# Patient Record
Sex: Male | Born: 2000 | Race: Black or African American | Hispanic: No | Marital: Single | Smoking: Never smoker
Health system: Southern US, Community
[De-identification: ages and names within clinical notes are randomized; demographics above are authoritative.]

## PROBLEM LIST (undated history)

## (undated) DIAGNOSIS — F329 Major depressive disorder, single episode, unspecified: Secondary | ICD-10-CM

## (undated) DIAGNOSIS — F32A Depression, unspecified: Secondary | ICD-10-CM

## (undated) DIAGNOSIS — F319 Bipolar disorder, unspecified: Secondary | ICD-10-CM

---
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
## 2020-04-02 ENCOUNTER — Emergency Department (HOSPITAL_COMMUNITY)
Admission: EM | Admit: 2020-04-02 | Discharge: 2020-04-02 | Disposition: A | Discharge status: Home / Self Care | Payer: Medicaid Other | Attending: Emergency Medicine | Admitting: Emergency Medicine

## 2020-04-02 ENCOUNTER — Emergency Department (HOSPITAL_COMMUNITY): Payer: Medicaid Other

## 2020-04-02 DIAGNOSIS — M542 Cervicalgia: Secondary | ICD-10-CM | POA: Insufficient documentation

## 2020-04-02 DIAGNOSIS — R110 Nausea: Secondary | ICD-10-CM | POA: Diagnosis not present

## 2020-04-02 DIAGNOSIS — R079 Chest pain, unspecified: Secondary | ICD-10-CM

## 2020-04-02 DIAGNOSIS — Y92410 Unspecified street and highway as the place of occurrence of the external cause: Secondary | ICD-10-CM | POA: Insufficient documentation

## 2020-04-02 DIAGNOSIS — R0781 Pleurodynia: Secondary | ICD-10-CM | POA: Diagnosis not present

## 2020-04-02 DIAGNOSIS — Z7722 Contact with and (suspected) exposure to environmental tobacco smoke (acute) (chronic): Secondary | ICD-10-CM | POA: Diagnosis not present

## 2020-04-02 DIAGNOSIS — R519 Headache, unspecified: Secondary | ICD-10-CM | POA: Insufficient documentation

## 2020-04-02 DIAGNOSIS — M546 Pain in thoracic spine: Secondary | ICD-10-CM

## 2020-04-02 DIAGNOSIS — M549 Dorsalgia, unspecified: Secondary | ICD-10-CM | POA: Insufficient documentation

## 2020-04-02 DIAGNOSIS — H53149 Visual discomfort, unspecified: Secondary | ICD-10-CM | POA: Diagnosis not present
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
## 2020-04-02 NOTE — Discharge Instructions (Addendum)
You have been seen here for neck, back and headaches.  I recommend taking over-the-counter pain medications like ibuprofen and/or Tylenol every 6 as needed.  Please follow dosage and on the back of bottle.  I also recommend applying heat to the area and stretching out the muscles as this will help decrease stiffness and pain.  I have given you information on exercises please follow.  I suspect he may be suffering from a slight concussion will recommend decreasing mental stimulation, defer excessive screen time and mentally stimulating activities.  Slowly reintroduce them as tolerated.  Please follow-up with your primary care provider in 1 week's time for reevaluation of your muscular pain.  Recommend following up with concussion clinic for worsening or continuing headaches.    Come back to the emergency department if you develop chest pain, shortness of breath, severe abdominal pain, uncontrolled nausea, vomiting, diarrhea.

## 2020-04-02 NOTE — ED Notes (Signed)
Pt d/c home per MD order. Discharge summary reviewed with pt, pt verbalizes understanding. Ambulatory off unit. No s/s of acute distress noted.  

## 2020-04-02 NOTE — ED Notes (Signed)
Pt transported to xray 

## 2020-04-03 ENCOUNTER — Telehealth: Payer: Self-pay | Admitting: *Deleted

## 2020-04-03 NOTE — Telephone Encounter (Signed)
Transition Care Management Unsuccessful Follow-up Telephone Call  Date of discharge and from where:  04-02-2020 [facility]  Attempts:  1  Reason for unsuccessful TCM follow-up call:  No answer, left voice mail

## 2020-04-04 ENCOUNTER — Telehealth: Payer: Self-pay | Admitting: *Deleted

## 2020-04-04 NOTE — Telephone Encounter (Signed)
Transition Care Management Follow-up Telephone Call  Date of discharge and from where:04-02-2020  How have you been since you were released from the hospital? no  Any questions or concerns? Concerned about headache .  Discussed importance of follow up with Primary Care. Discussed signs and symptoms of Concussion. Discussed treatments.  Items Reviewed:  Did the pt receive and understand the discharge instructions provided? Yes   Medications obtained and verified? Yes    Any new allergies since your discharge? No   Dietary orders reviewed? yes  Do you have support at home? Yes      Follow up appointments reviewed:   PCP Hospital f/u appt confirmed? No  Patient states will call today to schedule a follow up  Specialist Hospital f/u appt confirmed? No    Are transportation arrangements needed? No   If their condition worsens, is the pt aware to call PCP or go to the Emergency Dept.? yes  Was the patient provided with contact information for the PCP's office or ED? yes  Was to pt encouraged to call back with questions or concerns? yes

## 2020-04-05 NOTE — Telephone Encounter (Signed)
Duplicate

## 2020-05-04 ENCOUNTER — Ambulatory Visit: Payer: No Typology Code available for payment source | Admitting: Pediatrics

## 2020-05-24 ENCOUNTER — Encounter (HOSPITAL_COMMUNITY): Payer: Self-pay | Admitting: Internal Medicine

## 2020-05-24 ENCOUNTER — Other Ambulatory Visit: Payer: Self-pay

## 2020-05-24 ENCOUNTER — Ambulatory Visit (HOSPITAL_COMMUNITY)
Admission: EM | Admit: 2020-05-24 | Discharge: 2020-05-24 | Disposition: A | Discharge status: Home / Self Care | Payer: Medicaid Other | Attending: Internal Medicine | Admitting: Internal Medicine

## 2020-05-24 DIAGNOSIS — Z113 Encounter for screening for infections with a predominantly sexual mode of transmission: Secondary | ICD-10-CM | POA: Insufficient documentation

## 2020-05-24 DIAGNOSIS — H10212 Acute toxic conjunctivitis, left eye: Secondary | ICD-10-CM | POA: Diagnosis present

## 2020-05-24 MED ORDER — KETOROLAC TROMETHAMINE 0.5 % OP SOLN: 1.0000 [drp] | Freq: Four times a day (QID) | OPHTHALMIC | 0 refills | Status: AC | PRN

## 2020-05-24 MED ORDER — BACITRACIN-POLYMYXIN B 500-10000 UNIT/GM OP OINT: 1.0000 "application " | TOPICAL_OINTMENT | Freq: Two times a day (BID) | OPHTHALMIC | 0 refills | Status: AC

## 2020-05-24 MED ORDER — TETRACAINE HCL 0.5 % OP SOLN
OPHTHALMIC | Status: AC
  Filled 2020-05-24: qty 4

## 2020-05-24 NOTE — Discharge Instructions (Signed)
Use medications as directed If you have blurry vision, worsening eye pain or increasing light sensitivity please return to the urgent care immediately to be reevaluated. We will call you with recommendation if your lab results are abnormal.
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
## 2020-05-24 NOTE — ED Provider Notes (Signed)
[facility]    CSN: [id number] Arrival date & time: 05/24/20  1539      History   Chief Complaint Chief Complaint  Patient presents with  . Eye Problem  . SEXUALLY TRANSMITTED DISEASE    HPI [name] is a 20 y.o. male comes to the urgent care with pain/burning sensation in the left thigh which started 3 days ago.  Patient was doing some outdoor pressure washing with outdoor bleach when some of the bleach splashed into his left eye.  Following that he started experiencing burning sensation with pain in the left eye.  He did not irrigate his eye after the accident.  He denies any sensation of a foreign body in the ear.  He endorses some light sensitivity but denies any blurry vision.  He denies double vision.  Patient would like to be screened for STD.  He has no symptoms.   HPI  Past Medical History:  Diagnosis Date  . Bipolar 1 disorder (HCC)   . Chlamydia infection 06/03/2016   4/26 pos test, 5/1 treated  . Depression     Patient Active Problem List   Diagnosis Date Noted  . Anxiety state 08/24/2019  . History of gonorrhea 02/03/2019  . Severe recurrent major depression with psychotic features (HCC) 09/25/2018  . 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 Syncope 01/07/2018  . Chest wall pain 01/07/2018  . Poor diet 01/07/2018  . Allergic rhinitis 08/05/2017  . Abnormal hearing screen 08/05/2017  . History of hallucinations 07/01/2017  . -chronic right-sided headaches- concerning for migraines 05/29/2016  . Acne vulgaris 05/29/2016    History reviewed. No pertinent surgical history.     Home Medications    Prior to Admission medications   Medication Sig Start Date End Date Taking? Authorizing Provider  bacitracin-polymyxin b (POLYSPORIN) ophthalmic ointment Place 1 application into the left eye every 12 (twelve) hours for 5 days. apply to eye every 12 hours while awake 05/24/20 05/29/20 Yes Fumio Vandam, Britta Mccreedy, MD  ketorolac (ACULAR) 0.5 %  ophthalmic solution Place 1 drop into both eyes every 6 (six) hours as needed for up to 5 days. 05/24/20 05/29/20 Yes Namine Beahm, Britta Mccreedy, MD  promethazine (PHENERGAN) 25 MG tablet Take 1 tablet (25 mg total) by mouth every 6 (six) hours as needed for nausea or vomiting. Patient not taking: Reported on 05/26/2019 04/01/19 05/24/20  Gilda Crease, MD    Family History Family History  Problem Relation Age of Onset  . Healthy Mother   . Heart disease Neg Hx     Social History Social History   Tobacco Use  . Smoking status: Passive Smoke Exposure - Never Smoker  . Smokeless tobacco: Never Used  . Tobacco comment: mom smokes outside   Vaping Use  . Vaping Use: Some days  Substance Use Topics  . Alcohol use: Never  . Drug use: Not Currently    Types: Marijuana     Allergies   Patient has no known allergies.   Review of Systems Review of Systems  Eyes: Positive for photophobia, pain and redness. Negative for discharge, itching and visual disturbance.  Respiratory: Negative.   Gastrointestinal: Negative.   Genitourinary: Negative.   Neurological: Negative.      Physical Exam Triage Vital Signs ED Triage Vitals  Enc Vitals Group     BP 05/24/20 1607 108/78     Pulse Rate 05/24/20 1607 82     Resp 05/24/20 1607 18     Temp 05/24/20 1607 99.1  F (37.3 C)     Temp Source 05/24/20 1607 Oral     SpO2 05/24/20 1607 98 %     Weight --      Height --      Head Circumference --      Peak Flow --      Pain Score 05/24/20 1604 7     Pain Loc --      Pain Edu? --      Excl. in GC? --    No data found.  Updated Vital Signs BP 108/78 (BP Location: Left Arm)   Pulse 82   Temp 99.1 F (37.3 C) (Oral)   Resp 18   SpO2 98%   Visual Acuity Right Eye Distance: 20/20 Left Eye Distance: 20/20 Bilateral Distance: 20/20  Right Eye Near:   Left Eye Near:    Bilateral Near:     Physical Exam Vitals and nursing note reviewed.  HENT:     Right Ear: Tympanic membrane  normal.     Left Ear: Tympanic membrane normal.  Eyes:     Extraocular Movements: Extraocular movements intact.     Pupils: Pupils are equal, round, and reactive to light.     Comments: Conjunctival injection on the left eye.  No scleral icterus.  No corneal haziness.  Skin:    General: Skin is warm.      UC Treatments / Results  Labs (all labs ordered are listed, but only abnormal results are displayed) Labs Reviewed  CYTOLOGY, (ORAL, ANAL, URETHRAL) ANCILLARY ONLY    EKG   Radiology No results found.  Procedures Procedures (including critical care time)  Medications Ordered in UC Medications - No data to display  Initial Impression / Assessment and Plan / UC Course  I have reviewed the triage vital signs and the nursing notes.  Pertinent labs & imaging results that were available during my care of the patient were reviewed by me and considered in my medical decision making (see chart for details).     1.  Chemical conjunctivitis involving the left eye: Fluorescein stain was negative for corneal abrasion Ketorolac eyedrops as needed for pain Polysporin ophthalmic ointment twice daily for 5 days If symptoms worsen please return to urgent care to be reevaluated. Will call you with recommendations if lab results are abnormal. Final Clinical Impressions(s) / UC Diagnoses   Final diagnoses:  Chemical conjunctivitis of left eye  Screen for STD (sexually transmitted disease)     Discharge Instructions     Use medications as directed If you have blurry vision, worsening eye pain or increasing light sensitivity please return to the urgent care immediately to be reevaluated. We will call you with recommendation if your lab results are abnormal.   ED Prescriptions    Medication Sig Dispense Auth. Provider   bacitracin-polymyxin b (POLYSPORIN) ophthalmic ointment Place 1 application into the left eye every 12 (twelve) hours for 5 days. apply to eye every 12 hours  while awake 3.5 g Kani Jobson, Britta Mccreedy, MD   ketorolac (ACULAR) 0.5 % ophthalmic solution Place 1 drop into both eyes every 6 (six) hours as needed for up to 5 days. 5 mL Nonie Lochner, Britta Mccreedy, MD     PDMP not reviewed this encounter.   Merrilee Jansky, MD 05/24/20 [phone]

## 2020-05-25 LAB — CYTOLOGY, (ORAL, ANAL, URETHRAL) ANCILLARY ONLY
Chlamydia: NEGATIVE
Comment: NEGATIVE
Comment: NEGATIVE
Comment: NORMAL
Neisseria Gonorrhea: NEGATIVE
Trichomonas: NEGATIVE

## 2020-06-26 ENCOUNTER — Ambulatory Visit (HOSPITAL_COMMUNITY)
Admission: EM | Admit: 2020-06-26 | Discharge: 2020-06-26 | Disposition: A | Discharge status: Home / Self Care | Payer: Medicaid Other | Attending: Internal Medicine | Admitting: Internal Medicine

## 2020-06-26 ENCOUNTER — Other Ambulatory Visit: Payer: Self-pay

## 2020-06-26 ENCOUNTER — Ambulatory Visit (INDEPENDENT_AMBULATORY_CARE_PROVIDER_SITE_OTHER): Payer: Medicaid Other

## 2020-06-26 ENCOUNTER — Encounter (HOSPITAL_COMMUNITY): Payer: Self-pay | Admitting: *Deleted

## 2020-06-26 DIAGNOSIS — S6992XA Unspecified injury of left wrist, hand and finger(s), initial encounter: Secondary | ICD-10-CM

## 2020-06-26 DIAGNOSIS — S60222A Contusion of left hand, initial encounter: Secondary | ICD-10-CM

## 2020-06-26 DIAGNOSIS — M79642 Pain in left hand: Secondary | ICD-10-CM | POA: Diagnosis not present

## 2020-06-26 MED ORDER — NAPROXEN 500 MG PO TABS: 500.0000 mg | ORAL_TABLET | Freq: Two times a day (BID) | ORAL | 0 refills | Status: DC

## 2020-06-26 NOTE — Discharge Instructions (Signed)
You can take the Naproxen twice a day as needed for pain.   Rest as much as possible Ice for 10-15 minutes every 4-6 hours as needed for pain and swelling Compression- you can use an ace bandage or splint for comfort Elevate above your heart when sitting and laying down  Follow up with sports medicine or orthopedics if symptoms do not improve in the next week.   If you develop any loss of feeling, numbness, tingling, your fingers are cold to the touch, or blue, go to the Emergency Department for further evaluation.

## 2020-06-26 NOTE — ED Triage Notes (Signed)
PT closed truck door on his hand yesterday. Pt reports pain and swelling to hand and wrist. Pt moves Lt index finger and Lt middle finger. He has limited movement of Lt ring finger and Lt small finger.

## 2020-06-26 NOTE — ED Provider Notes (Signed)
[facility]    CSN: [id number] Arrival date & time: 06/26/20  3007      History   Chief Complaint Chief Complaint  Patient presents with  . Hand Injury    HPI [name] is a 20 y.o. male.   Patient here for evaluation of left hand pain after closing it in a door last night.  Patient with decreased range of motion, tenderness, and swelling.  Has not taken any OTC medications or treatments.  Reports pain as achy and constant.  Pain worse with movement.   Denies any fevers, chest pain, shortness of breath, N/V/D, numbness, tingling, weakness, abdominal pain, or headaches.    The history is provided by the patient.  Hand Injury   Past Medical History:  Diagnosis Date  . Bipolar 1 disorder (HCC)   . Chlamydia infection 06/03/2016   4/26 pos test, 5/1 treated  . Depression     Patient Active Problem List   Diagnosis Date Noted  . Anxiety state 08/24/2019  . History of gonorrhea 02/03/2019  . Severe recurrent major depression with psychotic features (HCC) 09/25/2018  . 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 Syncope 01/07/2018  . Chest wall pain 01/07/2018  . Poor diet 01/07/2018  . Allergic rhinitis 08/05/2017  . Abnormal hearing screen 08/05/2017  . History of hallucinations 07/01/2017  . -chronic right-sided headaches- concerning for migraines 05/29/2016  . Acne vulgaris 05/29/2016    History reviewed. No pertinent surgical history.     Home Medications    Prior to Admission medications   Medication Sig Start Date End Date Taking? Authorizing Provider  naproxen (NAPROSYN) 500 MG tablet Take 1 tablet (500 mg total) by mouth 2 (two) times daily. 06/26/20  Yes Ivette Loyal, NP  promethazine (PHENERGAN) 25 MG tablet Take 1 tablet (25 mg total) by mouth every 6 (six) hours as needed for nausea or vomiting. Patient not taking: Reported on 05/26/2019 04/01/19 05/24/20  Gilda Crease, MD    Family History Family History  Problem Relation  Age of Onset  . Healthy Mother   . Heart disease Neg Hx     Social History Social History   Tobacco Use  . Smoking status: Passive Smoke Exposure - Never Smoker  . Smokeless tobacco: Never Used  . Tobacco comment: mom smokes outside   Vaping Use  . Vaping Use: Some days  Substance Use Topics  . Alcohol use: Never  . Drug use: Not Currently    Types: Marijuana     Allergies   Patient has no known allergies.   Review of Systems Review of Systems  Musculoskeletal: Positive for arthralgias, joint swelling and myalgias.  All other systems reviewed and are negative.    Physical Exam Triage Vital Signs ED Triage Vitals  Enc Vitals Group     BP 06/26/20 1100 112/73     Pulse Rate 06/26/20 1100 70     Resp 06/26/20 1100 16     Temp 06/26/20 1100 98.5 F (36.9 C)     Temp src --      SpO2 06/26/20 1100 99 %     Weight --      Height --      Head Circumference --      Peak Flow --      Pain Score 06/26/20 1058 8     Pain Loc --      Pain Edu? --      Excl. in GC? --    No  data found.  Updated Vital Signs BP 112/73   Pulse 70   Temp 98.5 F (36.9 C)   Resp 16   SpO2 99%   Visual Acuity Right Eye Distance:   Left Eye Distance:   Bilateral Distance:    Right Eye Near:   Left Eye Near:    Bilateral Near:     Physical Exam Vitals and nursing note reviewed.  Constitutional:      General: He is not in acute distress.    Appearance: Normal appearance. He is not ill-appearing, toxic-appearing or diaphoretic.  HENT:     Head: Normocephalic and atraumatic.  Eyes:     Conjunctiva/sclera: Conjunctivae normal.  Cardiovascular:     Rate and Rhythm: Normal rate.     Pulses: Normal pulses.  Pulmonary:     Effort: Pulmonary effort is normal.  Abdominal:     General: Abdomen is flat.  Musculoskeletal:     Left hand: Swelling, tenderness (primarily to the MCP joint of the 5th and 4th fingers) and bony tenderness present. No lacerations. Decreased range of  motion. Decreased strength of finger abduction and thumb/finger opposition. Normal strength of wrist extension. Normal sensation. Normal capillary refill. Normal pulse.     Cervical back: Normal range of motion.  Skin:    General: Skin is warm and dry.  Neurological:     General: No focal deficit present.     Mental Status: He is alert and oriented to person, place, and time.  Psychiatric:        Mood and Affect: Mood normal.      UC Treatments / Results  Labs (all labs ordered are listed, but only abnormal results are displayed) Labs Reviewed - No data to display  EKG   Radiology DG Hand Complete Left  Result Date: 06/26/2020 CLINICAL DATA:  Closed hand in door with pain, initial encounter EXAM: LEFT HAND - COMPLETE 3+ VIEW COMPARISON:  None. FINDINGS: There is no evidence of fracture or dislocation. There is no evidence of arthropathy or other focal bone abnormality. Soft tissues are unremarkable. IMPRESSION: No acute abnormality noted. Electronically Signed   By: Alcide Clever M.D.   On: 06/26/2020 11:41    Procedures Procedures (including critical care time)  Medications Ordered in UC Medications - No data to display  Initial Impression / Assessment and Plan / UC Course  I have reviewed the triage vital signs and the nursing notes.  Pertinent labs & imaging results that were available during my care of the patient were reviewed by me and considered in my medical decision making (see chart for details).     Assessment negative for red flags or concerns. Xray with no acute abnormality noted.  Naproxen twice a day as needed for pain.  Recommend rest, ice, compression for comfort with an ace bandage, and elevation.  Recommend following up with ortho or sports medicine if symptoms do not improve in the next week.  Strict ED follow up for any worsening symptoms, such as numbness, tingling, fingers cold or blue.   Final Clinical Impressions(s) / UC Diagnoses   Final diagnoses:   Contusion of left hand, initial encounter  Injury of left hand, initial encounter     Discharge Instructions     You can take the Naproxen twice a day as needed for pain.   Rest as much as possible Ice for 10-15 minutes every 4-6 hours as needed for pain and swelling Compression- you can use an ace bandage or splint for  comfort Elevate above your heart when sitting and laying down  Follow up with sports medicine or orthopedics if symptoms do not improve in the next week.   If you develop any loss of feeling, numbness, tingling, your fingers are cold to the touch, or blue, go to the Emergency Department for further evaluation.      ED Prescriptions    Medication Sig Dispense Auth. Provider   naproxen (NAPROSYN) 500 MG tablet Take 1 tablet (500 mg total) by mouth 2 (two) times daily. 30 tablet Ivette Loyal, NP     PDMP not reviewed this encounter.   Ivette Loyal, NP 06/26/20 1157

## 2021-05-31 ENCOUNTER — Ambulatory Visit (HOSPITAL_COMMUNITY)
Admission: EM | Admit: 2021-05-31 | Discharge: 2021-05-31 | Disposition: A | Discharge status: Home / Self Care | Payer: Medicaid Other | Attending: Family Medicine | Admitting: Family Medicine

## 2021-05-31 ENCOUNTER — Encounter (HOSPITAL_COMMUNITY): Payer: Self-pay | Admitting: Emergency Medicine

## 2021-05-31 DIAGNOSIS — Z202 Contact with and (suspected) exposure to infections with a predominantly sexual mode of transmission: Secondary | ICD-10-CM | POA: Diagnosis present

## 2021-05-31 NOTE — ED Provider Notes (Signed)
?[facility] ? ? ? ?CSN: [id number] ?Arrival date & time: 05/31/21  1338 ? ? ?  ? ?History   ?Chief Complaint ?Chief Complaint  ?Patient presents with  ? Exposure to STD  ? ? ?HPI ?[name] is a 21 y.o. male.  ? ? ?Exposure to STD ? ?Here for STD testing. No dysuria or hematuria or penile dc. No f/c. No specific concerns voiced. He declines HIV/RPR testing. ? ?Past Medical History:  ?Diagnosis Date  ? Bipolar 1 disorder (HCC)   ? Chlamydia infection 06/03/2016  ? 4/26 pos test, 5/1 treated  ? Depression   ? ? ?Patient Active Problem List  ? Diagnosis Date Noted  ? Anxiety state 08/24/2019  ? History of gonorrhea 02/03/2019  ? Severe recurrent major depression with psychotic features (HCC) 09/25/2018  ? Delusional disorder (HCC) 09/25/2018  ? Syncope 01/07/2018  ? Chest wall pain 01/07/2018  ? Poor diet 01/07/2018  ? Allergic rhinitis 08/05/2017  ? Abnormal hearing screen 08/05/2017  ? History of hallucinations 07/01/2017  ? -chronic right-sided headaches- concerning for migraines 05/29/2016  ? Acne vulgaris 05/29/2016  ? ? ?History reviewed. No pertinent surgical history. ? ? ? ? ?Home Medications   ? ?Prior to Admission medications   ?Medication Sig Start Date End Date Taking? Authorizing Provider  ?promethazine (PHENERGAN) 25 MG tablet Take 1 tablet (25 mg total) by mouth every 6 (six) hours as needed for nausea or vomiting. ?Patient not taking: Reported on 05/26/2019 04/01/19 05/24/20  Gilda Crease, MD  ? ? ?Family History ?Family History  ?Problem Relation Age of Onset  ? Healthy Mother   ? Heart disease Neg Hx   ? ? ?Social History ?Social History  ? ?Tobacco Use  ? Smoking status: Passive Smoke Exposure - Never Smoker  ? Smokeless tobacco: Never  ? Tobacco comments:  ?  mom smokes outside   ?Vaping Use  ? Vaping Use: Some days  ?Substance Use Topics  ? Alcohol use: Never  ? Drug use: Not Currently  ?  Types: Marijuana  ? ? ? ?Allergies   ?Patient has no known allergies. ? ? ?Review of  Systems ?Review of Systems ? ? ?Physical Exam ?Triage Vital Signs ?ED Triage Vitals  ?Enc Vitals Group  ?   BP 05/31/21 1549 117/71  ?   Pulse Rate 05/31/21 1549 73  ?   Resp 05/31/21 1549 16  ?   Temp 05/31/21 1549 98.6 ?F (37 ?C)  ?   Temp Source 05/31/21 1549 Oral  ?   SpO2 05/31/21 1549 98 %  ?   Weight 05/31/21 1547 160 lb 0.9 oz (72.6 kg)  ?   Height 05/31/21 1547 5\' 9"  (1.753 m)  ?   Head Circumference --   ?   Peak Flow --   ?   Pain Score 05/31/21 1547 0  ?   Pain Loc --   ?   Pain Edu? --   ?   Excl. in GC? --   ? ?No data found. ? ?Updated Vital Signs ?BP 117/71 (BP Location: Right Arm)   Pulse 73   Temp 98.6 ?F (37 ?C) (Oral)   Resp 16   Ht 5\' 9"  (1.753 m)   Wt 72.6 kg   SpO2 98%   BMI 23.64 kg/m?  ? ?Visual Acuity ?Right Eye Distance:   ?Left Eye Distance:   ?Bilateral Distance:   ? ?Right Eye Near:   ?Left Eye Near:    ?Bilateral Near:    ? ?  Physical Exam ?Vitals and nursing note reviewed.  ?Constitutional:   ?   General: He is not in acute distress. ?   Appearance: He is not toxic-appearing.  ?Skin: ?   Capillary Refill: Capillary refill takes less than 2 seconds.  ?   Coloration: Skin is not jaundiced or pale.  ?Neurological:  ?   Mental Status: He is alert and oriented to person, place, and time.  ?Psychiatric:     ?   Behavior: Behavior normal.  ? ? ? ?UC Treatments / Results  ?Labs ?(all labs ordered are listed, but only abnormal results are displayed) ?Labs Reviewed  ?CYTOLOGY, (ORAL, ANAL, URETHRAL) ANCILLARY ONLY  ? ? ?EKG ? ? ?Radiology ?No results found. ? ?Procedures ?Procedures (including critical care time) ? ?Medications Ordered in UC ?Medications - No data to display ? ?Initial Impression / Assessment and Plan / UC Course  ?I have reviewed the triage vital signs and the nursing notes. ? ?Pertinent labs & imaging results that were available during my care of the patient were reviewed by me and considered in my medical decision making (see chart for details). ? ?  ? ?Staff will call  him if any positives, to treat per protocol. ?Final Clinical Impressions(s) / UC Diagnoses  ? ?Final diagnoses:  ?Possible exposure to STD  ? ? ? ?Discharge Instructions   ? ?  ?Staff will call you with any positives on the testing, to get you treated. ? ? ? ? ?ED Prescriptions   ?None ?  ? ?PDMP not reviewed this encounter. ?  ?Zenia Resides, MD ?05/31/21 1628 ? ?

## 2021-05-31 NOTE — ED Triage Notes (Signed)
Pt presents to UC for STD testing. Denies any symptoms.  ?

## 2021-05-31 NOTE — Discharge Instructions (Addendum)
Staff will call you with any positives on the testing, to get you treated. ?

## 2021-06-04 LAB — CYTOLOGY, (ORAL, ANAL, URETHRAL) ANCILLARY ONLY
Chlamydia: NEGATIVE
Comment: NEGATIVE
Comment: NEGATIVE
Comment: NORMAL
Neisseria Gonorrhea: NEGATIVE
Trichomonas: NEGATIVE

## 2021-07-15 ENCOUNTER — Emergency Department (HOSPITAL_COMMUNITY): Payer: Medicaid Other

## 2021-07-15 ENCOUNTER — Emergency Department (HOSPITAL_COMMUNITY)
Admission: EM | Admit: 2021-07-15 | Discharge: 2021-07-15 | Disposition: A | Discharge status: Home / Self Care | Payer: Medicaid Other | Attending: Emergency Medicine | Admitting: Emergency Medicine

## 2021-07-15 ENCOUNTER — Other Ambulatory Visit: Payer: Self-pay

## 2021-07-15 DIAGNOSIS — S0081XA Abrasion of other part of head, initial encounter: Secondary | ICD-10-CM | POA: Diagnosis not present

## 2021-07-15 DIAGNOSIS — Y92410 Unspecified street and highway as the place of occurrence of the external cause: Secondary | ICD-10-CM | POA: Diagnosis not present

## 2021-07-15 DIAGNOSIS — Z23 Encounter for immunization: Secondary | ICD-10-CM | POA: Diagnosis not present

## 2021-07-15 DIAGNOSIS — T1490XA Injury, unspecified, initial encounter: Secondary | ICD-10-CM

## 2021-07-15 DIAGNOSIS — R1032 Left lower quadrant pain: Secondary | ICD-10-CM | POA: Diagnosis not present

## 2021-07-15 DIAGNOSIS — S0993XA Unspecified injury of face, initial encounter: Secondary | ICD-10-CM | POA: Diagnosis present

## 2021-07-15 LAB — CBC
HCT: 46.3 % (ref 39.0–52.0)
Hemoglobin: 15.8 g/dL (ref 13.0–17.0)
MCH: 32.7 pg (ref 26.0–34.0)
MCHC: 34.1 g/dL (ref 30.0–36.0)
MCV: 95.9 fL (ref 80.0–100.0)
Platelets: 212 10*3/uL (ref 150–400)
RBC: 4.83 MIL/uL (ref 4.22–5.81)
RDW: 11.5 % (ref 11.5–15.5)
WBC: 3.8 10*3/uL — ABNORMAL LOW (ref 4.0–10.5)
nRBC: 0 % (ref 0.0–0.2)

## 2021-07-15 LAB — URINALYSIS, ROUTINE W REFLEX MICROSCOPIC
Bilirubin Urine: NEGATIVE
Glucose, UA: NEGATIVE mg/dL
Hgb urine dipstick: NEGATIVE
Ketones, ur: NEGATIVE mg/dL
Leukocytes,Ua: NEGATIVE
Nitrite: NEGATIVE
Protein, ur: NEGATIVE mg/dL
Specific Gravity, Urine: 1.044 — ABNORMAL HIGH (ref 1.005–1.030)
pH: 6 (ref 5.0–8.0)

## 2021-07-15 LAB — I-STAT CHEM 8, ED
BUN: 17 mg/dL (ref 6–20)
Calcium, Ion: 1.12 mmol/L — ABNORMAL LOW (ref 1.15–1.40)
Chloride: 105 mmol/L (ref 98–111)
Creatinine, Ser: 1.5 mg/dL — ABNORMAL HIGH (ref 0.61–1.24)
Glucose, Bld: 93 mg/dL (ref 70–99)
HCT: 46 % (ref 39.0–52.0)
Hemoglobin: 15.6 g/dL (ref 13.0–17.0)
Potassium: 4.6 mmol/L (ref 3.5–5.1)
Sodium: 139 mmol/L (ref 135–145)
TCO2: 23 mmol/L (ref 22–32)

## 2021-07-15 LAB — COMPREHENSIVE METABOLIC PANEL
ALT: 23 U/L (ref 0–44)
AST: 19 U/L (ref 15–41)
Albumin: 4.2 g/dL (ref 3.5–5.0)
Alkaline Phosphatase: 51 U/L (ref 38–126)
Anion gap: 7 (ref 5–15)
BUN: 15 mg/dL (ref 6–20)
CO2: 25 mmol/L (ref 22–32)
Calcium: 9.3 mg/dL (ref 8.9–10.3)
Chloride: 106 mmol/L (ref 98–111)
Creatinine, Ser: 1.39 mg/dL — ABNORMAL HIGH (ref 0.61–1.24)
GFR, Estimated: >60 mL/min (ref >60)
Glucose, Bld: 98 mg/dL (ref 70–99)
Potassium: 4.4 mmol/L (ref 3.5–5.1)
Sodium: 138 mmol/L (ref 135–145)
Total Bilirubin: 0.7 mg/dL (ref 0.3–1.2)
Total Protein: 7 g/dL (ref 6.5–8.1)

## 2021-07-15 LAB — SAMPLE TO BLOOD BANK

## 2021-07-15 LAB — PROTIME-INR
INR: 1 (ref 0.8–1.2)
Prothrombin Time: 13.2 seconds (ref 11.4–15.2)

## 2021-07-15 LAB — LACTIC ACID, PLASMA: Lactic Acid, Venous: 1.2 mmol/L (ref 0.5–1.9)

## 2021-07-15 LAB — ETHANOL: Alcohol, Ethyl (B): <10 mg/dL (ref <10)

## 2021-07-15 MED ORDER — IOHEXOL 350 MG/ML SOLN
100.0000 mL | Freq: Once | INTRAVENOUS | Status: AC | PRN
  Administered 2021-07-15: 100 mL via INTRAVENOUS

## 2021-07-15 MED ORDER — METHOCARBAMOL 500 MG PO TABS: 1000.0000 mg | ORAL_TABLET | Freq: Two times a day (BID) | ORAL | 0 refills | Status: AC

## 2021-07-15 MED ORDER — ACETAMINOPHEN 325 MG PO TABS
650.0000 mg | ORAL_TABLET | Freq: Four times a day (QID) | ORAL | 0 refills | Status: DC | PRN
Start: 2021-07-15 — End: 2022-08-19

## 2021-07-15 MED ORDER — IBUPROFEN 600 MG PO TABS: 600.0000 mg | ORAL_TABLET | Freq: Four times a day (QID) | ORAL | 0 refills | Status: DC | PRN

## 2021-07-15 MED ORDER — LACTATED RINGERS IV BOLUS
1000.0000 mL | Freq: Once | INTRAVENOUS | Status: AC
  Administered 2021-07-15: 1000 mL via INTRAVENOUS

## 2021-07-15 MED ORDER — TETANUS-DIPHTH-ACELL PERTUSSIS 5-2.5-18.5 LF-MCG/0.5 IM SUSY
0.5000 mL | PREFILLED_SYRINGE | Freq: Once | INTRAMUSCULAR | Status: AC
  Administered 2021-07-15: 0.5 mL via INTRAMUSCULAR
  Filled 2021-07-15: qty 0.5
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
## 2021-07-15 NOTE — ED Notes (Signed)
Family at bedside, updated on POC Urine sample obtained and sent to lab.

## 2021-07-15 NOTE — Discharge Instructions (Addendum)
It was a pleasure caring for you today in the emergency department. ° °Please return to the emergency department for any worsening or worrisome symptoms. ° ° °
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
## 2021-07-15 NOTE — ED Notes (Signed)
Ccollar removed, ok per EDP
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
## 2021-07-15 NOTE — ED Notes (Signed)
CT tech to inquire regarding results of CAP
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
## 2021-07-15 NOTE — ED Notes (Signed)
Pt and family verbalize understanding of discharge instructions, follow up and medications. Pt ambulatory to WR with family. NAD, steady gait.
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
## 2021-07-16 IMAGING — CT CT ABD-PELV W/ CM
2 of 4 series · 15 of 46 positions shown, 17 images · IV contrast (APPLIED)
Comparison: None

CLINICAL DATA: Generalized abdominal pain with emesis, mild
dizziness

EXAM:
CT ABDOMEN AND PELVIS WITH CONTRAST
TECHNIQUE: Multidetector CT imaging of the abdomen and pelvis was performed
using the standard protocol following bolus administration of
intravenous contrast.
CONTRAST:  100mL OMNIPAQUE IOHEXOL 300 MG/ML  SOLN

[Series 3: abdomen 5.0 · axial · 0.77mm/px · z∈[-708,-283]mm · 12 of 97 slices shown, 14 images]
[im 6/97  soft-tissue]
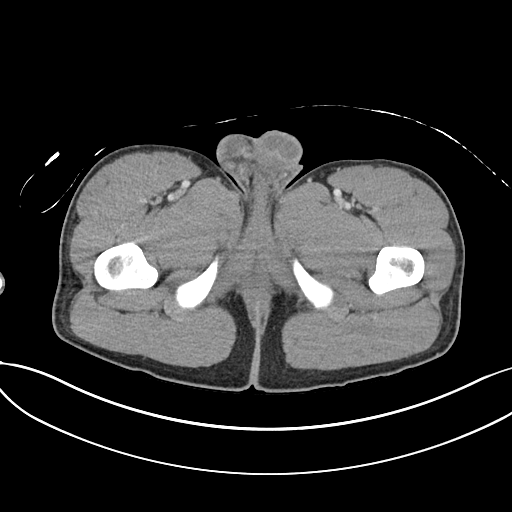
[im 6/97  bone]
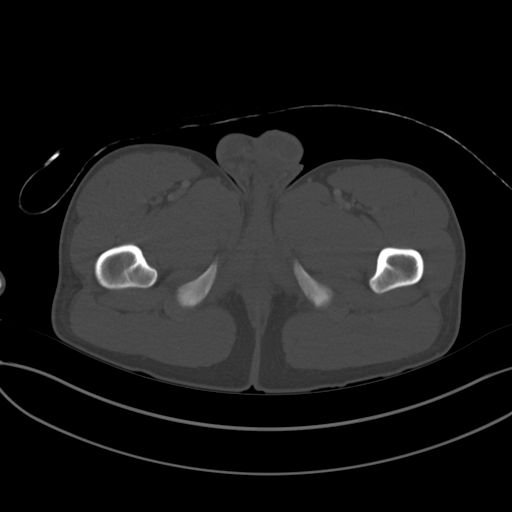
[im 17/97  soft-tissue]
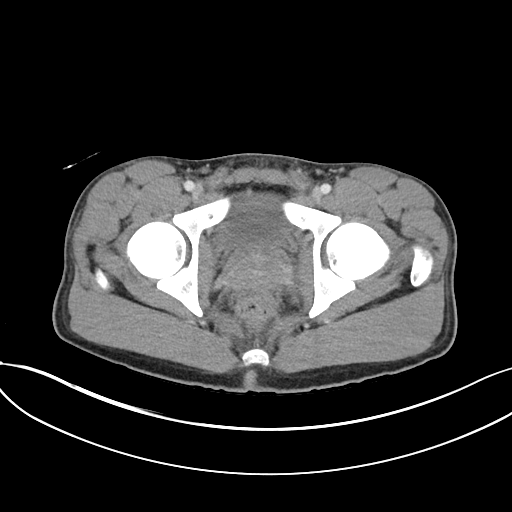
[im 22/97  soft-tissue]
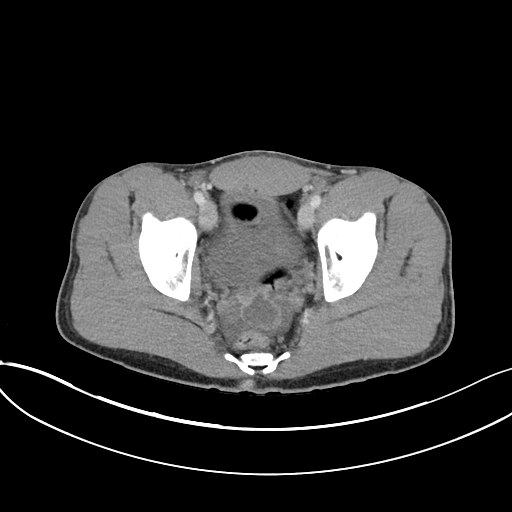
[im 27/97  soft-tissue]
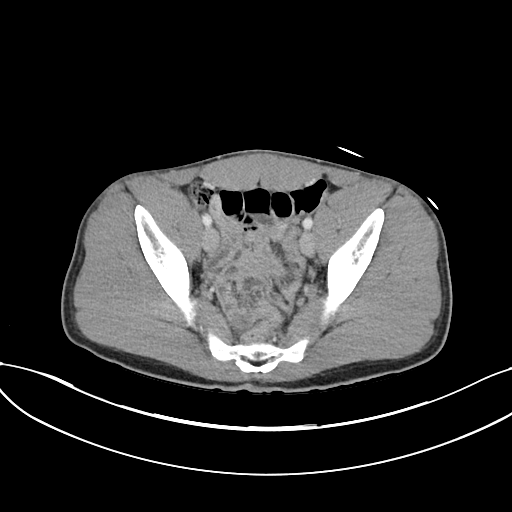
[im 38/97  soft-tissue]
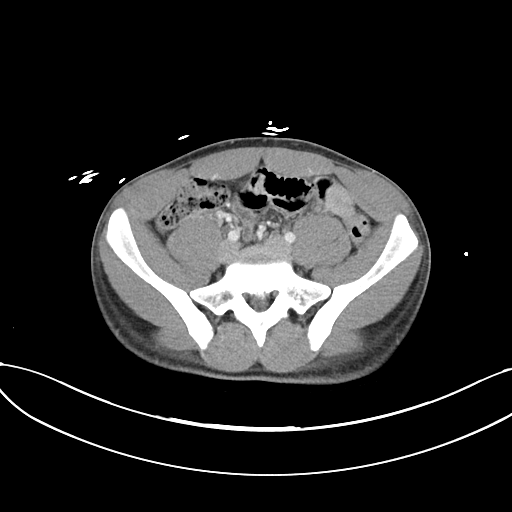
[im 43/97  soft-tissue]
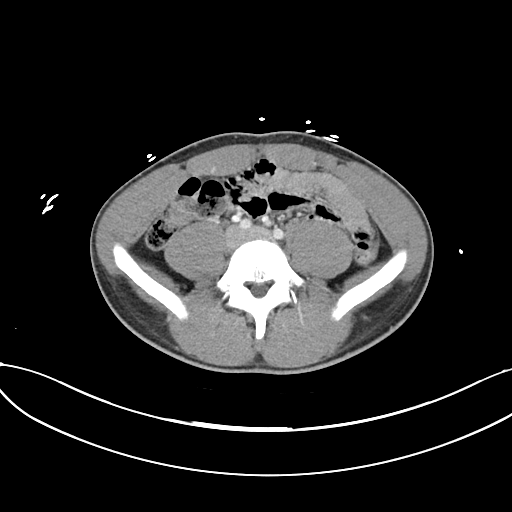
[im 54/97  soft-tissue]
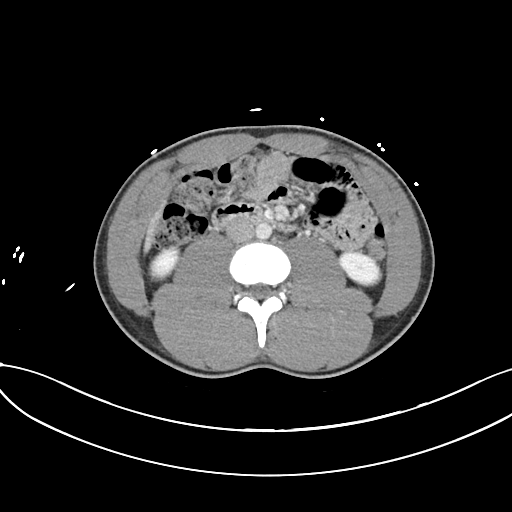
[im 59/97  soft-tissue]
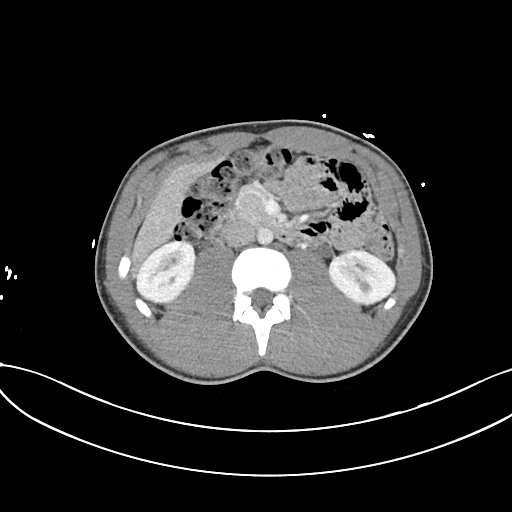
[im 70/97  soft-tissue]
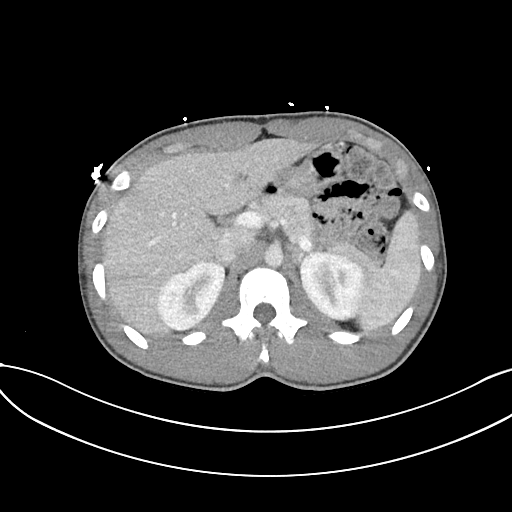
[im 70/97  bone]
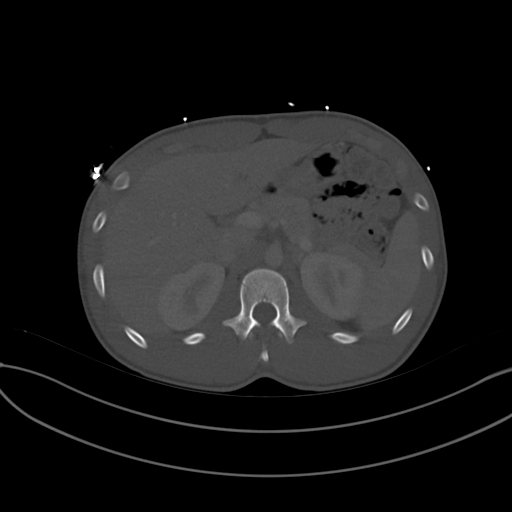
[im 75/97  soft-tissue]
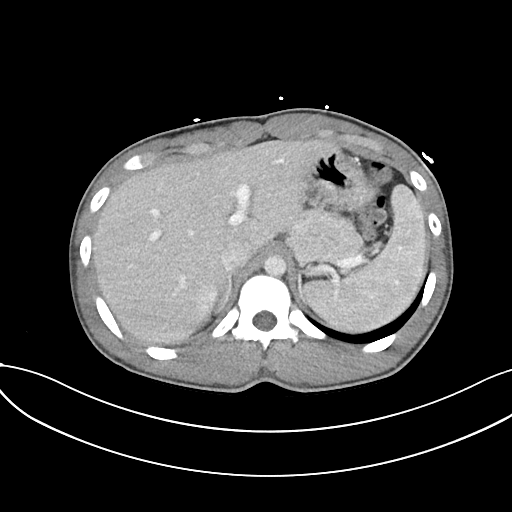
[im 81/97  soft-tissue]
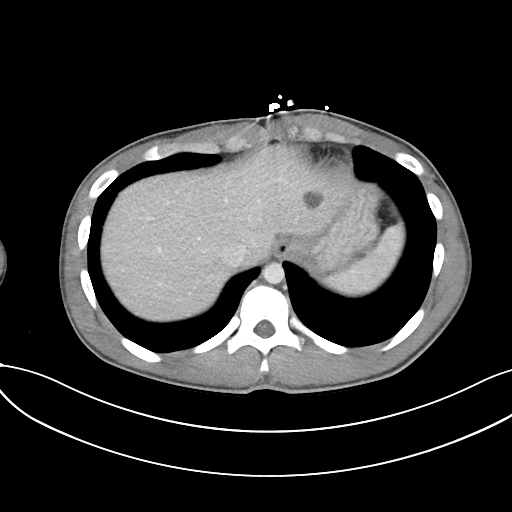
[im 91/97  soft-tissue]
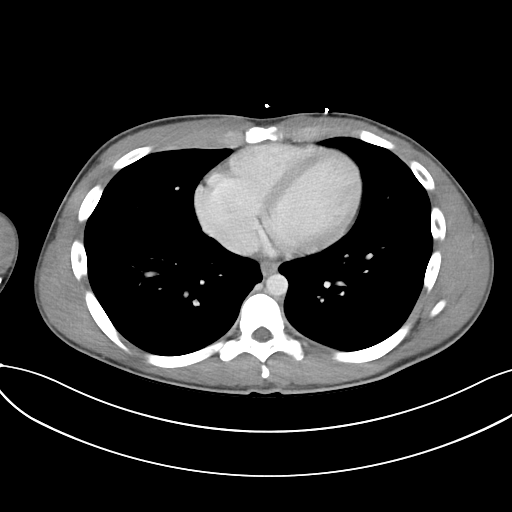

[Series 6: abdomen 3.0 mpr cor · coronal · 0.65mm/px · 3 of 78 slices shown]
[im 26/78  soft-tissue]
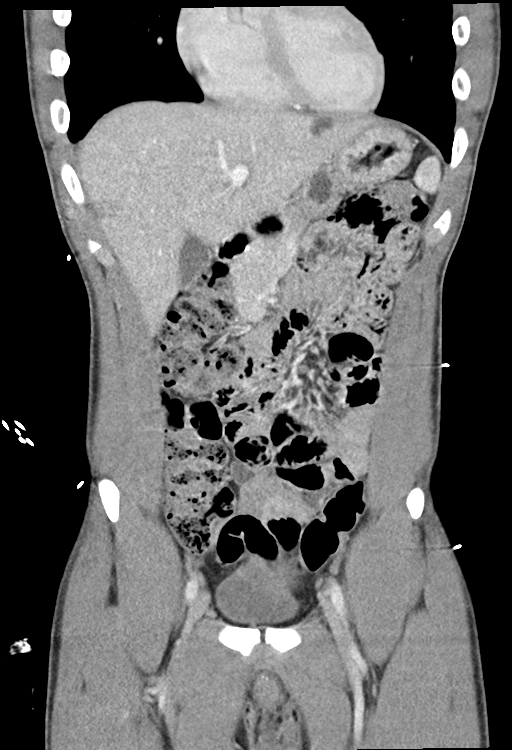
[im 35/78  soft-tissue]
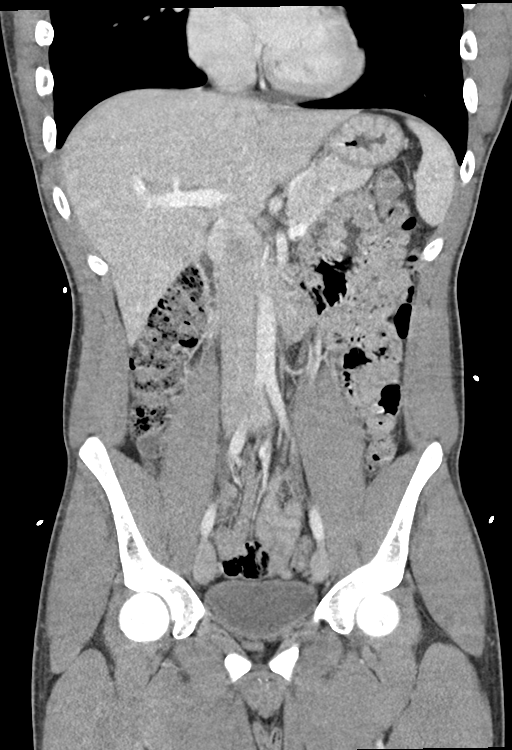
[im 43/78  soft-tissue]
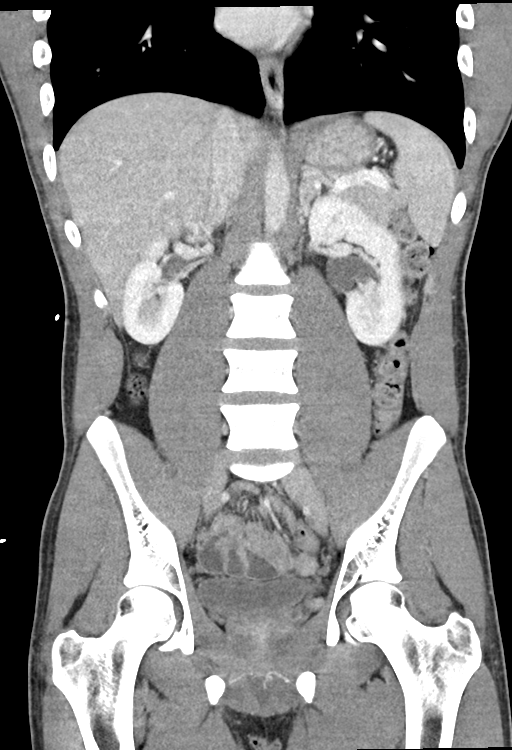

[15 of 46 positions shown; findings below may reference images not displayed]

FINDINGS: Lower chest: Lung bases are clear. Normal heart size. No pericardial
effusion.

Hepatobiliary: Smoothly marginated 1.3 cm fluid attenuation cystic
structure in the left lobe liver. Compatible with benign hepatic
cyst. No worrisome liver lesions. Normal gallbladder without
pericholecystic fluid or inflammation, calcified gallstones or
biliary ductal dilatation.

Pancreas: Unremarkable. No pancreatic ductal dilatation or
surrounding inflammatory changes.

Spleen: Normal in size without focal abnormality.

Adrenals/Urinary Tract: Adrenal glands are unremarkable. Kidneys are
normal, without renal calculi, focal lesion, or hydronephrosis. No
abnormality along the course of either ureter. Bladder is
unremarkable.

Stomach/Bowel: Distal esophagus, stomach and duodenum are
unremarkable. No abnormally thickened or dilated loops of small
bowel. No evidence assess by a bowel obstruction. Normal appendix in
the right lower quadrant. Moderate stool burden. No colonic wall
thickening or dilatation.

Vascular/Lymphatic: The aorta is normal caliber. Evaluation for
abdominal adenopathy limited given a paucity of intraperitoneal fat.
No grossly enlarged lymph nodes.

Reproductive: Question some mild heterogeneous enhancement of the
prostate, nonspecific. Small amount of fluid within the deep pelvis.

Other: Trace low-attenuation free fluid in the pelvis. No free air.
No bowel containing hernias.

Musculoskeletal: No acute osseous abnormality or suspicious osseous
lesion.
IMPRESSION: 1. No evidence of bowel obstruction. Normal appendix.
2. Question some mild heterogeneous enhancement of the prostate,
nonspecific with a small bowel of low-attenuation fluid in the
pelvis. Could correlate for symptoms of prostatitis though finding
may be related to contrast timing.
3. No other acute abdominopelvic abnormality.

## 2021-07-29 ENCOUNTER — Ambulatory Visit (HOSPITAL_COMMUNITY)
Admission: EM | Admit: 2021-07-29 | Discharge: 2021-07-29 | Disposition: A | Discharge status: Home / Self Care | Payer: Medicaid Other | Attending: Physician Assistant | Admitting: Physician Assistant

## 2021-07-29 ENCOUNTER — Ambulatory Visit (INDEPENDENT_AMBULATORY_CARE_PROVIDER_SITE_OTHER): Payer: Medicaid Other

## 2021-07-29 ENCOUNTER — Encounter (HOSPITAL_COMMUNITY): Payer: Self-pay | Admitting: Emergency Medicine

## 2021-07-29 DIAGNOSIS — M546 Pain in thoracic spine: Secondary | ICD-10-CM | POA: Diagnosis not present

## 2021-07-29 DIAGNOSIS — M545 Low back pain, unspecified: Secondary | ICD-10-CM

## 2021-07-29 MED ORDER — PREDNISONE 10 MG (21) PO TBPK: ORAL_TABLET | ORAL | 0 refills | Status: DC

## 2021-07-29 MED ORDER — IBUPROFEN 800 MG PO TABS
ORAL_TABLET | ORAL | Status: AC
  Filled 2021-07-29: qty 1

## 2021-07-29 MED ORDER — IBUPROFEN 800 MG PO TABS
800.0000 mg | ORAL_TABLET | Freq: Once | ORAL | Status: AC
  Administered 2021-07-29: 800 mg via ORAL

## 2021-07-29 MED ORDER — BACLOFEN 10 MG PO TABS: 10.0000 mg | ORAL_TABLET | Freq: Two times a day (BID) | ORAL | 0 refills | Status: DC

## 2021-08-07 ENCOUNTER — Ambulatory Visit (INDEPENDENT_AMBULATORY_CARE_PROVIDER_SITE_OTHER): Payer: Medicaid Other | Admitting: Family Medicine

## 2021-08-07 VITALS — BP 124/84 | Ht 69.0 in | Wt 180.0 lb

## 2021-08-07 DIAGNOSIS — F4310 Post-traumatic stress disorder, unspecified: Secondary | ICD-10-CM | POA: Diagnosis not present

## 2021-08-07 DIAGNOSIS — S29012A Strain of muscle and tendon of back wall of thorax, initial encounter: Secondary | ICD-10-CM | POA: Diagnosis not present

## 2021-08-07 DIAGNOSIS — S161XXA Strain of muscle, fascia and tendon at neck level, initial encounter: Secondary | ICD-10-CM

## 2021-08-07 MED ORDER — METHOCARBAMOL 500 MG PO TABS: 500.0000 mg | ORAL_TABLET | Freq: Three times a day (TID) | ORAL | 1 refills | Status: DC | PRN

## 2021-08-07 MED ORDER — MELOXICAM 15 MG PO TABS: 15.0000 mg | ORAL_TABLET | Freq: Every day | ORAL | 2 refills | Status: DC
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
## 2021-08-07 NOTE — Patient Instructions (Signed)
You have thoracic and cervical strains. Take meloxicam 15 mg daily with food for pain and inflammation. Robaxin as needed for muscle spasms (if this makes you sleepy only take at bedtime - it is not addictive). Start physical therapy and do home exercises/stretches on days you don't go to therapy. We also placed a referral to psychology for PTSD. Follow up with me in 1 month.

## 2021-08-12 ENCOUNTER — Ambulatory Visit: Payer: Medicaid Other | Attending: Pediatrics | Admitting: Physical Therapy

## 2021-08-12 ENCOUNTER — Other Ambulatory Visit: Payer: Self-pay

## 2021-08-12 ENCOUNTER — Encounter: Payer: Self-pay | Admitting: Physical Therapy

## 2021-08-12 DIAGNOSIS — M5459 Other low back pain: Secondary | ICD-10-CM | POA: Insufficient documentation

## 2021-08-12 DIAGNOSIS — S29012A Strain of muscle and tendon of back wall of thorax, initial encounter: Secondary | ICD-10-CM | POA: Diagnosis not present

## 2021-08-12 DIAGNOSIS — M6281 Muscle weakness (generalized): Secondary | ICD-10-CM | POA: Diagnosis present

## 2021-08-12 DIAGNOSIS — S161XXA Strain of muscle, fascia and tendon at neck level, initial encounter: Secondary | ICD-10-CM | POA: Diagnosis not present

## 2021-08-12 DIAGNOSIS — M542 Cervicalgia: Secondary | ICD-10-CM | POA: Insufficient documentation
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
## 2021-08-12 NOTE — Patient Instructions (Signed)
Access Code: [email or web address] URL: [email or web address] Date: 08/12/2021 Prepared by: Rosana Hoes  Exercises - Supine Lower Trunk Rotation  - 2 x daily - 10 reps - 5 seconds hold - Hooklying Single Knee to Chest Stretch  - 2 x daily - 3 reps - 20 seconds hold - Sidelying Thoracic Lumbar Rotation  - 2 x daily - 10 reps - 5 seconds hold - Child's Pose Stretch  - 2 x daily - 3 reps - 20 seconds hold - Seated Hamstring Stretch  - 2 x daily - 3 reps - 20 seconds hold - Gentle Upper Trap Stretch  - 2 x daily - 3 reps - 20 seconds hold

## 2021-08-20 ENCOUNTER — Ambulatory Visit: Payer: Medicaid Other | Admitting: Physical Therapy

## 2021-08-20 ENCOUNTER — Telehealth: Payer: Self-pay | Admitting: Physical Therapy

## 2021-08-20 NOTE — Telephone Encounter (Signed)
Left voicemail on mobile phone regarding missed appointment. Left reminder of next appointments and attendance policy.

## 2021-08-22 ENCOUNTER — Telehealth: Payer: Self-pay | Admitting: Physical Therapy

## 2021-08-22 ENCOUNTER — Ambulatory Visit: Payer: Medicaid Other | Admitting: Physical Therapy

## 2021-08-22 NOTE — Telephone Encounter (Signed)
Spoke to a patient regarding no-show x 2. He reports that he did not have the phone number to call and cancel his physical therapy appointments. He is currently being seen by a chiropractor and asks to hold off on PT appointments for now. Explained that patient would need a new referral when he returns. Pt verbalized understanding. All future appointments canceled.

## 2021-08-26 ENCOUNTER — Ambulatory Visit: Payer: Medicaid Other

## 2021-09-05 ENCOUNTER — Encounter: Payer: Medicaid Other | Admitting: Physical Therapy

## 2021-09-09 ENCOUNTER — Ambulatory Visit: Payer: Medicaid Other | Admitting: Family Medicine

## 2021-09-09 ENCOUNTER — Encounter: Payer: Medicaid Other | Admitting: Physical Therapy

## 2021-09-12 ENCOUNTER — Encounter: Payer: Medicaid Other | Admitting: Physical Therapy

## 2021-12-04 ENCOUNTER — Ambulatory Visit
Admission: EM | Admit: 2021-12-04 | Discharge: 2021-12-04 | Disposition: A | Discharge status: Home / Self Care | Payer: Medicaid Other | Attending: Physician Assistant | Admitting: Physician Assistant

## 2021-12-04 DIAGNOSIS — Z1152 Encounter for screening for COVID-19: Secondary | ICD-10-CM | POA: Insufficient documentation

## 2021-12-04 DIAGNOSIS — B349 Viral infection, unspecified: Secondary | ICD-10-CM | POA: Insufficient documentation

## 2021-12-04 LAB — RESP PANEL BY RT-PCR (FLU A&B, COVID) ARPGX2
Influenza A by PCR: NEGATIVE
Influenza B by PCR: NEGATIVE
SARS Coronavirus 2 by RT PCR: NEGATIVE

## 2021-12-04 MED ORDER — ONDANSETRON 4 MG PO TBDP: 4.0000 mg | ORAL_TABLET | Freq: Three times a day (TID) | ORAL | 0 refills | Status: DC | PRN

## 2021-12-04 NOTE — ED Provider Notes (Signed)
[facility]    CSN: [id number] Arrival date & time: 12/04/21  1332      History   Chief Complaint Chief Complaint  Patient presents with   Emesis    HPI [name] is a 21 y.o. male.   Patient here today for evaluation of nausea, vomiting, generalized abdominal pain that started 3 days ago.  He reports he has had some diarrhea as well.  He denies any blood in his stool or vomit.  He has not had any dark or tarry stools.  He has had low-grade fever.  He does not report treatment for symptoms.  The history is provided by the patient.  Emesis Associated symptoms: abdominal pain and diarrhea   Associated symptoms: no chills, no cough, no fever and no sore throat     Past Medical History:  Diagnosis Date   Bipolar 1 disorder ([place])    Chlamydia infection 06/03/2016   4/26 pos test, 5/1 treated   Depression     Patient Active Problem List   Diagnosis Date Noted   Anxiety state 08/24/2019   History of gonorrhea 02/03/2019   Severe recurrent major depression with psychotic features ([place]) 09/25/2018   Delusional disorder ([place]) 09/25/2018   Syncope 01/07/2018   Chest wall pain 01/07/2018   Poor diet 01/07/2018   Allergic rhinitis 08/05/2017   Abnormal hearing screen 08/05/2017   History of hallucinations 07/01/2017   -chronic right-sided headaches- concerning for migraines 05/29/2016   Acne vulgaris 05/29/2016    History reviewed. No pertinent surgical history.     Home Medications    Prior to Admission medications   Medication Sig Start Date End Date Taking? Authorizing Provider  ondansetron (ZOFRAN-ODT) 4 MG disintegrating tablet Take 1 tablet (4 mg total) by mouth every 8 (eight) hours as needed. 12/04/21  Yes Francene Finders, PA-C  acetaminophen (TYLENOL) 325 MG tablet Take 2 tablets (650 mg total) by mouth every 6 (six) hours as needed. 07/15/21   Jeanell Sparrow, DO  baclofen (LIORESAL) 10 MG tablet Take 1 tablet (10 mg total) by mouth 2 (two)  times daily. 07/29/21   Raspet, Derry Skill, PA-C  ibuprofen (ADVIL) 600 MG tablet Take 1 tablet (600 mg total) by mouth every 6 (six) hours as needed. 07/15/21   Jeanell Sparrow, DO  meloxicam (MOBIC) 15 MG tablet Take 1 tablet (15 mg total) by mouth daily. 08/07/21   Hudnall, Sharyn Lull, MD  methocarbamol (ROBAXIN) 500 MG tablet Take 1 tablet (500 mg total) by mouth every 8 (eight) hours as needed for muscle spasms. 08/07/21   Hudnall, Sharyn Lull, MD  predniSONE (STERAPRED UNI-PAK 21 TAB) 10 MG (21) TBPK tablet As directed 07/29/21   Raspet, Derry Skill, PA-C  promethazine (PHENERGAN) 25 MG tablet Take 1 tablet (25 mg total) by mouth every 6 (six) hours as needed for nausea or vomiting. Patient not taking: Reported on 05/26/2019 04/01/19 05/24/20  Orpah Greek, MD    Family History Family History  Problem Relation Age of Onset   Healthy Mother    Heart disease Neg Hx     Social History Social History   Tobacco Use   Smoking status: Never    Passive exposure: Yes   Smokeless tobacco: Never   Tobacco comments:    mom smokes outside   Vaping Use   Vaping Use: Some days  Substance Use Topics   Alcohol use: Never   Drug use: Not Currently    Types: Marijuana  Allergies   Patient has no known allergies.   Review of Systems Review of Systems  Constitutional:  Negative for chills and fever.  HENT:  Negative for congestion and sore throat.   Eyes:  Negative for discharge and redness.  Respiratory:  Negative for cough.   Gastrointestinal:  Positive for abdominal pain, diarrhea, nausea and vomiting.  Neurological:  Negative for numbness.     Physical Exam Triage Vital Signs ED Triage Vitals [12/04/21 1620]  Enc Vitals Group     BP 118/75     Pulse Rate 71     Resp 18     Temp 99.1 F (37.3 C)     Temp Source Oral     SpO2 98 %     Weight      Height      Head Circumference      Peak Flow      Pain Score 8     Pain Loc      Pain Edu?      Excl. in [place]?    No data  found.  Updated Vital Signs BP 118/75 (BP Location: Left Arm)   Pulse 71   Temp 99.1 F (37.3 C) (Oral)   Resp 18   SpO2 98%    Physical Exam Vitals and nursing note reviewed.  Constitutional:      General: He is not in acute distress.    Appearance: Normal appearance. He is not ill-appearing.     Comments: Smiling, no apparent discomfort  HENT:     Head: Normocephalic and atraumatic.  Eyes:     Conjunctiva/sclera: Conjunctivae normal.  Cardiovascular:     Rate and Rhythm: Normal rate and regular rhythm.     Heart sounds: Normal heart sounds. No murmur heard. Pulmonary:     Effort: Pulmonary effort is normal. No respiratory distress.     Breath sounds: Normal breath sounds. No wheezing, rhonchi or rales.  Abdominal:     General: Abdomen is flat. Bowel sounds are normal. There is no distension.     Palpations: Abdomen is soft.     Tenderness: There is abdominal tenderness (diffuse mild TTP). There is no guarding or rebound.  Neurological:     Mental Status: He is alert.  Psychiatric:        Mood and Affect: Mood normal.        Behavior: Behavior normal.        Thought Content: Thought content normal.      UC Treatments / Results  Labs (all labs ordered are listed, but only abnormal results are displayed) Labs Reviewed  RESP PANEL BY RT-PCR (FLU A&B, COVID) [id number]    EKG   Radiology No results found.  Procedures Procedures (including critical care time)  Medications Ordered in UC Medications - No data to display  Initial Impression / Assessment and Plan / UC Course  I have reviewed the triage vital signs and the nursing notes.  Pertinent labs & imaging results that were available during my care of the patient were reviewed by me and considered in my medical decision making (see chart for details).   Suspect likely viral etiology of symptoms.  Will order screening for COVID and flu.  Zofran prescribed to hopefully improve nausea.  Recommended follow-up if  no gradual improvement or with any further concerns.  Advised patient to report to the emergency room with any worsening abdominal pain as he may need imaging at that time.   Final Clinical Impressions(s) /  UC Diagnoses   Final diagnoses:  Viral syndrome  Encounter for screening for COVID-19   Discharge Instructions   None    ED Prescriptions     Medication Sig Dispense Auth. Provider   ondansetron (ZOFRAN-ODT) 4 MG disintegrating tablet Take 1 tablet (4 mg total) by mouth every 8 (eight) hours as needed. 20 tablet Francene Finders, PA-C      PDMP not reviewed this encounter.   Francene Finders, PA-C 12/04/21 [phone]

## 2021-12-04 NOTE — ED Triage Notes (Signed)
Pt c/o nausea, vomiting, diarrhea, abd pain x 2-3 days. States pain is in periumbilical area. Also states lightheaded.

## 2022-01-03 ENCOUNTER — Ambulatory Visit
Admission: EM | Admit: 2022-01-03 | Discharge: 2022-01-03 | Disposition: A | Discharge status: Other Acute Inpatient Hospital | Payer: Medicaid Other

## 2022-01-03 DIAGNOSIS — R042 Hemoptysis: Secondary | ICD-10-CM

## 2022-01-03 DIAGNOSIS — R0781 Pleurodynia: Secondary | ICD-10-CM | POA: Diagnosis not present

## 2022-01-03 NOTE — ED Provider Notes (Signed)
[facility]    CSN: [id number] Arrival date & time: 01/03/22  1322      History   Chief Complaint Chief Complaint  Patient presents with   Hemoptysis    HPI Grove Neilan is a 21 y.o. male.   Patient presents with hemoptysis and left-sided rib pain.  Patient reports that he had approximately 1 teaspoonful of bright red blood that he coughed up at approximately 10:30 AM this morning.  He reports that he has been having a cough over the past few days but denies any associated upper respiratory symptoms or fever.  He also reports that he started having some left-sided rib pain approximately 3 days ago but denies any apparent injury to the area.  He also reports some intermittent shortness of breath. He denies that he takes any bloodthinning medications.     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 No pertinent surgical history.     Home Medications    Prior to Admission medications   Medication Sig Start Date End Date Taking? Authorizing Provider  acetaminophen (TYLENOL) 325 MG tablet Take 2 tablets (650 mg total) by mouth every 6 (six) hours as needed. 07/15/21   Sloan Leiter, DO  baclofen (LIORESAL) 10 MG tablet Take 1 tablet (10 mg total) by mouth 2 (two) times daily. 07/29/21   Raspet, Noberto Retort, PA-C  ibuprofen (ADVIL) 600 MG  tablet Take 1 tablet (600 mg total) by mouth every 6 (six) hours as needed. 07/15/21   Sloan Leiter, DO  meloxicam (MOBIC) 15 MG tablet Take 1 tablet (15 mg total) by mouth daily. 08/07/21   Hudnall, Azucena Fallen, MD  methocarbamol (ROBAXIN) 500 MG tablet Take 1 tablet (500 mg total) by mouth every 8 (eight) hours as needed for muscle spasms. 08/07/21   Hudnall, Azucena Fallen, MD  ondansetron (ZOFRAN-ODT) 4 MG disintegrating tablet Take 1 tablet (4 mg total) by mouth every 8 (eight) hours as needed. 12/04/21   Tomi Bamberger, PA-C  predniSONE (STERAPRED UNI-PAK 21 TAB) 10 MG (21) TBPK tablet As directed 07/29/21   Raspet, Noberto Retort, PA-C  promethazine (PHENERGAN) 25 MG tablet Take 1 tablet (25 mg total) by mouth every 6 (six) hours as needed for nausea or vomiting. Patient not taking: Reported on 05/26/2019 04/01/19 05/24/20  Gilda Crease, MD    Family History Family History  Problem Relation Age of Onset   Healthy Mother    Heart disease Neg Hx     Social History Social History   Tobacco Use   Smoking status: Every Day    Types: Cigars    Passive exposure: Yes   Smokeless tobacco: Former   Tobacco comments:    mom smokes outside   Advertising account planner   Vaping Use: Some days  Substance Use Topics   Alcohol use: Never  Drug use: Not Currently    Types: Marijuana     Allergies   Patient has no known allergies.   Review of Systems Review of Systems Per HPI  Physical Exam Triage Vital Signs ED Triage Vitals  Enc Vitals Group     BP 01/03/22 1434 131/80     Pulse Rate 01/03/22 1434 70     Resp 01/03/22 1434 15     Temp 01/03/22 1434 98.1 F (36.7 C)     Temp src --      SpO2 01/03/22 1434 98 %     Weight --      Height --      Head Circumference --      Peak Flow --      Pain Score 01/03/22 1433 8     Pain Loc --      Pain Edu? --      Excl. in [place]? --    No data found.  Updated Vital Signs BP 131/80   Pulse 70   Temp 98.1 F (36.7 C)   Resp 15   SpO2 98%   Visual  Acuity Right Eye Distance:   Left Eye Distance:   Bilateral Distance:    Right Eye Near:   Left Eye Near:    Bilateral Near:     Physical Exam Constitutional:      General: He is not in acute distress.    Appearance: Normal appearance. He is not toxic-appearing or diaphoretic.  HENT:     Head: Normocephalic and atraumatic.  Eyes:     Extraocular Movements: Extraocular movements intact.     Conjunctiva/sclera: Conjunctivae normal.  Cardiovascular:     Rate and Rhythm: Normal rate and regular rhythm.     Pulses: Normal pulses.     Heart sounds: Normal heart sounds.  Pulmonary:     Effort: Pulmonary effort is normal. No respiratory distress.     Breath sounds: Normal breath sounds.  Chest:     Chest wall: Tenderness present.     Comments: Significant tenderness to palpation to left lower ribs.  No obvious swelling, discoloration, lacerations, abrasions noted. Abdominal:     Comments: Significant tenderness to palpation to left upper quadrant but unsure if this is radiation from left ribs.  Neurological:     General: No focal deficit present.     Mental Status: He is alert and oriented to person, place, and time. Mental status is at baseline.  Psychiatric:        Mood and Affect: Mood normal.        Behavior: Behavior normal.        Thought Content: Thought content normal.        Judgment: Judgment normal.      UC Treatments / Results  Labs (all labs ordered are listed, but only abnormal results are displayed) Labs Reviewed - No data to display  EKG   Radiology No results found.  Procedures Procedures (including critical care time)  Medications Ordered in UC Medications - No data to display  Initial Impression / Assessment and Plan / UC Course  I have reviewed the triage vital signs and the nursing notes.  Pertinent labs & imaging results that were available during my care of the patient were reviewed by me and considered in my medical decision making (see  chart for details).     Given patient's description of hemoptysis, this is concerning for frank hemoptysis.  Patient was also significantly tender to left rib  cage.  He also has some tenderness to palpation to left upper quadrant of abdomen but unsure if this is radiation from the ribs.  Discussed with patient limitations of evaluation for these types of symptoms here in urgent care.  I strongly advised for patient to go to the ER for further evaluation and management and he was agreeable with plan.  Vital signs and patient stable at discharge.  Agree with patient self transport to the hospital. Final Clinical Impressions(s) / UC Diagnoses   Final diagnoses:  Hemoptysis  Rib pain on left side     Discharge Instructions      Please go to the emergency department as soon as you leave urgent care for further evaluation and management.    ED Prescriptions   None    PDMP not reviewed this encounter.   Gustavus Bryant, [place] 01/03/22 [phone]

## 2022-01-03 NOTE — ED Notes (Signed)
Patient is being discharged from the Urgent Care and sent to the Emergency Department via pov . Per mound np, patient is in need of higher level of care due to symptoms . Patient is aware and verbalizes understanding of plan of care.  Vitals:   01/03/22 1434  BP: 131/80  Pulse: 70  Resp: 15  Temp: 98.1 F (36.7 C)  SpO2: 98%

## 2022-01-03 NOTE — ED Triage Notes (Signed)
Pt presents to uc with co of hemoptysis this morning around 10: 30 the patient st he coughed and it was straight blood about 1 spoon full worth. Pt reports no reoccurance since thin but has had sharpe pain L sided ribs for 3 days,.

## 2022-01-03 NOTE — Discharge Instructions (Signed)
Please go to the emergency department as soon as you leave urgent care for further evaluation and management. ?

## 2022-06-25 ENCOUNTER — Ambulatory Visit
Admission: EM | Admit: 2022-06-25 | Discharge: 2022-06-25 | Disposition: A | Discharge status: Home / Self Care | Payer: Medicaid Other | Attending: Nurse Practitioner | Admitting: Nurse Practitioner

## 2022-06-25 DIAGNOSIS — M722 Plantar fascial fibromatosis: Secondary | ICD-10-CM

## 2022-06-25 MED ORDER — METHYLPREDNISOLONE 4 MG PO TBPK: ORAL_TABLET | ORAL | 0 refills | Status: DC

## 2022-06-25 NOTE — Discharge Instructions (Addendum)
Your exam and symptoms are consistent with plantar fasciitis.  Will do a steroid Dosepak to bring down the inflammation Advised to get an over-the-counter shoe insert for plantar fasciitis to help support the foot Rest is much as you can Role your foot on a frozen water bottle or cold soda can in the morning to help stretch it out before you get up Please follow-up with podiatry if your symptoms or not improving

## 2022-06-25 NOTE — ED Triage Notes (Addendum)
Pt presents to UC w/ c/o right foot pain x1 month. Pt ambulates independently to triage room. Pt states he has tried ibuprofen, tylenol, and one of his "old prescription muscle relaxers" without relief. Pt sprained foot "years ago" and has pain "on and off."

## 2022-06-25 NOTE — ED Provider Notes (Signed)
[facility]    CSN: [id number] Arrival date & time: 06/25/22  1401      History   Chief Complaint No chief complaint on file.   HPI Shalom Tetteh is a 22 y.o. male presents for evaluation of right foot pain.  Patient states a month of pain to the sole of his right foot.  Denies any known injury but does state he sprained his foot "years ago".  He states it is worse in the morning when he first gets out of bed and gradually improves throughout the day.  He has tried Tylenol and ibuprofen with no improvement.  No numbness or tingling or weakness.  He does do a lot of standing for work.  No other concerns at this time.  HPI  Past Medical History:  Diagnosis Date   Bipolar 1 disorder (HCC)    Chlamydia infection 06/03/2016   4/26 pos test, 5/1 treated   Depression     Patient Active Problem List   Diagnosis Date Noted   Anxiety state 08/24/2019   History of gonorrhea 02/03/2019   Severe recurrent major depression with psychotic features (HCC) 09/25/2018   Delusional disorder (HCC) 09/25/2018   Syncope 01/07/2018   Chest wall pain 01/07/2018   Poor diet 01/07/2018   Allergic rhinitis 08/05/2017   Abnormal hearing screen 08/05/2017   History of hallucinations 07/01/2017   -chronic right-sided headaches- concerning for migraines 05/29/2016   Acne vulgaris 05/29/2016    History reviewed. No pertinent surgical history.     Home Medications    Prior to Admission medications   Medication Sig Start Date End Date Taking? Authorizing Provider  methylPREDNISolone (MEDROL DOSEPAK) 4 MG TBPK tablet Please take as directed on package 06/25/22  Yes Radford Pax, NP  acetaminophen (TYLENOL) 325 MG tablet Take 2 tablets (650 mg total) by mouth every 6 (six) hours as needed. 07/15/21   Sloan Leiter, DO  baclofen (LIORESAL) 10 MG tablet Take 1 tablet (10 mg total) by mouth 2 (two) times daily. 07/29/21   Raspet, Noberto Retort, PA-C  ibuprofen (ADVIL) 600 MG tablet Take 1 tablet  (600 mg total) by mouth every 6 (six) hours as needed. 07/15/21   Sloan Leiter, DO  meloxicam (MOBIC) 15 MG tablet Take 1 tablet (15 mg total) by mouth daily. 08/07/21   Hudnall, Azucena Fallen, MD  methocarbamol (ROBAXIN) 500 MG tablet Take 1 tablet (500 mg total) by mouth every 8 (eight) hours as needed for muscle spasms. 08/07/21   Hudnall, Azucena Fallen, MD  ondansetron (ZOFRAN-ODT) 4 MG disintegrating tablet Take 1 tablet (4 mg total) by mouth every 8 (eight) hours as needed. 12/04/21   Tomi Bamberger, PA-C  promethazine (PHENERGAN) 25 MG tablet Take 1 tablet (25 mg total) by mouth every 6 (six) hours as needed for nausea or vomiting. Patient not taking: Reported on 05/26/2019 04/01/19 05/24/20  Gilda Crease, MD    Family History Family History  Problem Relation Age of Onset   Healthy Mother    Heart disease Neg Hx     Social History Social History   Tobacco Use   Smoking status: Every Day    Types: Cigars    Passive exposure: Yes   Smokeless tobacco: Former   Tobacco comments:    mom smokes outside   Building services engineer Use: Some days  Substance Use Topics   Alcohol use: Never   Drug use: Not Currently    Types: Marijuana  Allergies   Patient has no known allergies.   Review of Systems Review of Systems  Musculoskeletal:        Right foot pain      Physical Exam Triage Vital Signs ED Triage Vitals  Enc Vitals Group     BP 06/25/22 1518 117/73     Pulse Rate 06/25/22 1518 70     Resp 06/25/22 1518 16     Temp 06/25/22 1518 98.8 F (37.1 C)     Temp Source 06/25/22 1518 Oral     SpO2 06/25/22 1518 98 %     Weight --      Height --      Head Circumference --      Peak Flow --      Pain Score 06/25/22 1517 5     Pain Loc --      Pain Edu? --      Excl. in GC? --    No data found.  Updated Vital Signs BP 117/73 (BP Location: Left Arm)   Pulse 70   Temp 98.8 F (37.1 C) (Oral)   Resp 16   SpO2 98%   Visual Acuity Right Eye Distance:   Left Eye  Distance:   Bilateral Distance:    Right Eye Near:   Left Eye Near:    Bilateral Near:     Physical Exam Vitals and nursing note reviewed.  Constitutional:      General: He is not in acute distress.    Appearance: Normal appearance. He is not ill-appearing.  HENT:     Head: Normocephalic and atraumatic.  Eyes:     Pupils: Pupils are equal, round, and reactive to light.  Cardiovascular:     Rate and Rhythm: Normal rate.  Pulmonary:     Effort: Pulmonary effort is normal.  Musculoskeletal:       Feet:  Feet:     Comments: No swelling or ecchymosis of the right foot.  Moderate tenderness to palpation to plantar fascia.  DP +2.  Strength 5 out of 5 bilateral lower extremities Skin:    General: Skin is warm and dry.  Neurological:     General: No focal deficit present.     Mental Status: He is alert and oriented to person, place, and time.  Psychiatric:        Mood and Affect: Mood normal.        Behavior: Behavior normal.      UC Treatments / Results  Labs (all labs ordered are listed, but only abnormal results are displayed) Labs Reviewed - No data to display  EKG   Radiology No results found.  Procedures Procedures (including critical care time)  Medications Ordered in UC Medications - No data to display  Initial Impression / Assessment and Plan / UC Course  I have reviewed the triage vital signs and the nursing notes.  Pertinent labs & imaging results that were available during my care of the patient were reviewed by me and considered in my medical decision making (see chart for details).     Reviewed exam and symptoms with patient.  Discussed exam consistent with plantar fasciitis Medrol Dosepak Discussed shoe inserts and exercises for first thing in the morning Patient to see podiatry if symptoms do not improve ER precautions reviewed and patient verbalized understanding Final Clinical Impressions(s) / UC Diagnoses   Final diagnoses:  Plantar  fasciitis, right     Discharge Instructions      Your exam and  symptoms are consistent with plantar fasciitis.  Will do a steroid Dosepak to bring down the inflammation Advised to get an over-the-counter shoe insert for plantar fasciitis to help support the foot Rest is much as you can Role your foot on a frozen water bottle or cold soda can in the morning to help stretch it out before you get up Please follow-up with podiatry if your symptoms or not improving   ED Prescriptions     Medication Sig Dispense Auth. Provider   methylPREDNISolone (MEDROL DOSEPAK) 4 MG TBPK tablet Please take as directed on package 21 tablet Radford Pax, NP      PDMP not reviewed this encounter.   Radford Pax, NP 06/25/22 1539

## 2022-06-29 ENCOUNTER — Emergency Department (HOSPITAL_COMMUNITY): Payer: Medicaid Other

## 2022-06-29 ENCOUNTER — Encounter (HOSPITAL_COMMUNITY): Payer: Self-pay

## 2022-06-29 ENCOUNTER — Emergency Department (HOSPITAL_COMMUNITY)
Admission: EM | Admit: 2022-06-29 | Discharge: 2022-06-29 | Disposition: A | Discharge status: Home / Self Care | Payer: Medicaid Other | Attending: Emergency Medicine | Admitting: Emergency Medicine

## 2022-06-29 ENCOUNTER — Other Ambulatory Visit: Payer: Self-pay

## 2022-06-29 DIAGNOSIS — S6722XA Crushing injury of left hand, initial encounter: Secondary | ICD-10-CM | POA: Insufficient documentation

## 2022-06-29 DIAGNOSIS — Y990 Civilian activity done for income or pay: Secondary | ICD-10-CM | POA: Insufficient documentation

## 2022-06-29 DIAGNOSIS — W231XXA Caught, crushed, jammed, or pinched between stationary objects, initial encounter: Secondary | ICD-10-CM | POA: Insufficient documentation

## 2022-06-29 MED ORDER — KETOROLAC TROMETHAMINE 15 MG/ML IJ SOLN
15.0000 mg | Freq: Once | INTRAMUSCULAR | Status: AC
  Administered 2022-06-29: 15 mg via INTRAMUSCULAR
  Filled 2022-06-29: qty 1

## 2022-06-29 MED ORDER — ACETAMINOPHEN 500 MG PO TABS
1000.0000 mg | ORAL_TABLET | Freq: Once | ORAL | Status: AC
  Administered 2022-06-29: 1000 mg via ORAL
  Filled 2022-06-29: qty 2

## 2022-06-29 NOTE — ED Triage Notes (Signed)
Pt arrives c/o L ring finger pain/hand after injuring it while working on a vehicle prior to arrival. Small laceration noted to knuckle. Denies meds PTA. Last tetanus shot was last year.

## 2022-06-29 NOTE — ED Provider Notes (Signed)
[place] EMERGENCY DEPARTMENT AT [facility] Provider Note   CSN: [id number] Arrival date & time: 06/29/22  2126     History  Chief Complaint  Patient presents with   Finger Injury    [name] is a 22 y.o. male with h/o major depression with psychotic features, syncope, anxiety who presents with crush injury to the left hand.  Patient was working on a car when the re was initially with a jack and the frame of the car came down and fell on his left hand.  Most of his pain is around his palmar aspect of his left ring finger, however he does have pain in the third through fifth fingers as well.  Denies any numbness or tingling.  Pain radiating upward toward his wrist.  HPI     Home Medications Prior to Admission medications   Medication Sig Start Date End Date Taking? Authorizing Provider  acetaminophen (TYLENOL) 325 MG tablet Take 2 tablets (650 mg total) by mouth every 6 (six) hours as needed. 07/15/21   Sloan Leiter, DO  baclofen (LIORESAL) 10 MG tablet Take 1 tablet (10 mg total) by mouth 2 (two) times daily. 07/29/21   Raspet, Noberto Retort, PA-C  ibuprofen (ADVIL) 600 MG tablet Take 1 tablet (600 mg total) by mouth every 6 (six) hours as needed. 07/15/21   Sloan Leiter, DO  meloxicam (MOBIC) 15 MG tablet Take 1 tablet (15 mg total) by mouth daily. 08/07/21   Hudnall, Azucena Fallen, MD  methocarbamol (ROBAXIN) 500 MG tablet Take 1 tablet (500 mg total) by mouth every 8 (eight) hours as needed for muscle spasms. 08/07/21   Lenda Kelp, MD  methylPREDNISolone (MEDROL DOSEPAK) 4 MG TBPK tablet Please take as directed on package 06/25/22   Radford Pax, NP  ondansetron (ZOFRAN-ODT) 4 MG disintegrating tablet Take 1 tablet (4 mg total) by mouth every 8 (eight) hours as needed. 12/04/21   Tomi Bamberger, PA-C  promethazine (PHENERGAN) 25 MG tablet Take 1 tablet (25 mg total) by mouth every 6 (six) hours as needed for nausea or vomiting. Patient not taking: Reported on 05/26/2019  04/01/19 05/24/20  Gilda Crease, MD      Allergies    Patient has no known allergies.    Review of Systems   Review of Systems Review of systems Negative for open wounds, head trauma.  A 10 point review of systems was performed and is negative unless otherwise reported in HPI.  Physical Exam Updated Vital Signs BP 133/82   Pulse 97   Temp 98.5 F (36.9 C) (Oral)   Resp 20   Ht 5\' 9"  (1.753 m)   Wt 81.6 kg   SpO2 98%   BMI 26.58 kg/m  Physical Exam General: Normal appearing male, lying in bed.  HEENT: Sclera anicteric, MMM, trachea midline.  Cardiology: RRR, no murmurs/rubs/gallops. BL radial pulses equal bilaterally.  Resp: Normal respiratory rate and effort. Abd: Soft, non-tender, non-distended.  MSK: Mild swelling to the left hand.  Tenderness outpatient diffusely to the left third through fifth digits without any deformities noted or open wounds.  Tenderness ovation to the third through fifth carpals and the dorsal and palmar aspects of the hand without any gross deformities noted.  Intact opposition of the thumb.  Positive left anatomic snuffbox tenderness.  Intact flexion extension of all digits of the left hand.  Intact flexion extension of the wrist. Skin: warm, dry.  Neuro: A&Ox4, CNs II-XII grossly intact. MAEs.  Sensation grossly intact.  Psych: Normal mood and affect.   ED Results / Procedures / Treatments   Labs (all labs ordered are listed, but only abnormal results are displayed) Labs Reviewed - No data to display  EKG None  Radiology DG Hand Complete Left  Result Date: 06/29/2022 CLINICAL DATA:  Injured ring finger under the car [place]. EXAM: LEFT HAND - COMPLETE 3+ VIEW COMPARISON:  None Available. FINDINGS: There is no evidence of fracture or dislocation. There is no evidence of arthropathy or other focal bone abnormality. Soft tissues swelling about the base of the fourth digit. IMPRESSION: Soft tissue swelling about the base of the fourth digit  without evidence of fracture or dislocation. Electronically Signed   By: Larose Hires D.O.   On: 06/29/2022 22:02    Procedures Procedures    Medications Ordered in ED Medications  acetaminophen (TYLENOL) tablet 1,000 mg (1,000 mg Oral Given 06/29/22 2223)  ketorolac (TORADOL) 15 MG/ML injection 15 mg (15 mg Intramuscular Given 06/29/22 2224)    ED Course/ Medical Decision Making/ A&P                          Medical Decision Making Amount and/or Complexity of Data Reviewed Radiology: ordered. Decision-making details documented in ED Course.  Risk OTC drugs. Prescription drug management.    MDM:    Patient with crush injury to his left hand.  No open wounds that need repair.  Patient with significant tensional patient however no deformities noted.  X-rays reassuring with only soft tissue swelling with no fracture or dislocation.  Patient does have anatomic snuffbox tenderness on the left and will be placed in a thumb spica splint and instructed to follow-up with PCP in 1 week for repeat exam and x-rays.  Patient reports understanding.  Recommend Tylenol ibuprofen for pain control, rest ice, and elevation.  Discharged with discharge instructions and return precautions.  All questions answered to patient's satisfaction.   Clinical Course as of 06/29/22 2328  Sun Jun 29, 2022  2209 DG Hand Complete Left Soft tissue swelling about the base of the fourth digit without evidence of fracture or dislocation.   [HN]    Clinical Course User Index [HN] Loetta Rough, MD    Imaging Studies ordered: Triage ordered imaging studies including hand XR I independently visualized and interpreted imaging. I agree with the radiologist interpretation  Social Determinants of Health: Patient lives independently   Disposition:  DC w/ discharge instructions/return precautions. All questions answered to patient's satisfaction.    Co morbidities that complicate the patient evaluation  Past  Medical History:  Diagnosis Date   Bipolar 1 disorder (HCC)    Chlamydia infection 06/03/2016   4/26 pos test, 5/1 treated   Depression      Medicines Meds ordered this encounter  Medications   acetaminophen (TYLENOL) tablet 1,000 mg   ketorolac (TORADOL) 15 MG/ML injection 15 mg    I have reviewed the patients home medicines and have made adjustments as needed  Problem List / ED Course: Problem List Items Addressed This Visit   None Visit Diagnoses     Crush injury to hand, left, initial encounter    -  Primary                   This note was created using dictation software, which may contain spelling or grammatical errors.    Loetta Rough, MD 06/29/22 [phone]

## 2022-06-29 NOTE — Discharge Instructions (Addendum)
Thank you for coming to [facility] Emergency Department. You were seen for crush injury to the hand. We did an exam, and imaging, and these showed no acute findings. Your hand will be sore and likely swollen. You can alternate taking Tylenol and ibuprofen as needed for pain. You can take 650mg  tylenol (acetaminophen) every 4-6 hours, and 600 mg ibuprofen 3 times a day. Please rest, ice, and elevate the hand to help prevent and treat pain and swelling. You had tenderness over an area called the "anatomic snuffbox," which means that you could potentially have a fracture of a bone called the scaphoid which may not show up on x-ray.  Please wear this thumb spica splint until you can follow-up in a week with your primary care physician.  You may need to have a repeat x-ray.  Do not hesitate to return to the ED or call 911 if you experience: -Worsening symptoms -Numbness/tingling -Lightheadedness, passing out -Fevers/chills -Anything else that concerns you

## 2022-07-01 ENCOUNTER — Emergency Department (HOSPITAL_COMMUNITY)
Admission: EM | Admit: 2022-07-01 | Discharge: 2022-07-01 | Disposition: A | Discharge status: Home / Self Care | Payer: Medicaid Other | Attending: Emergency Medicine | Admitting: Emergency Medicine

## 2022-07-01 ENCOUNTER — Other Ambulatory Visit: Payer: Self-pay

## 2022-07-01 ENCOUNTER — Encounter (HOSPITAL_COMMUNITY): Payer: Self-pay

## 2022-07-01 DIAGNOSIS — Z1152 Encounter for screening for COVID-19: Secondary | ICD-10-CM | POA: Diagnosis not present

## 2022-07-01 DIAGNOSIS — J029 Acute pharyngitis, unspecified: Secondary | ICD-10-CM | POA: Diagnosis present

## 2022-07-01 LAB — GROUP A STREP BY PCR: Group A Strep by PCR: NOT DETECTED

## 2022-07-01 LAB — RESP PANEL BY RT-PCR (RSV, FLU A&B, COVID)  RVPGX2
Influenza A by PCR: NEGATIVE
Influenza B by PCR: NEGATIVE
Resp Syncytial Virus by PCR: NEGATIVE
SARS Coronavirus 2 by RT PCR: NEGATIVE

## 2022-07-01 MED ORDER — ACETAMINOPHEN 500 MG PO TABS
1000.0000 mg | ORAL_TABLET | Freq: Once | ORAL | Status: AC
  Administered 2022-07-01: 1000 mg via ORAL
  Filled 2022-07-01: qty 2

## 2022-07-01 MED ORDER — IBUPROFEN 800 MG PO TABS
800.0000 mg | ORAL_TABLET | Freq: Once | ORAL | Status: AC
Start: 2022-07-01 — End: 2022-07-01
  Administered 2022-07-01: 800 mg via ORAL
  Filled 2022-07-01: qty 1

## 2022-07-01 NOTE — ED Provider Notes (Signed)
[name] [facility] AT [facility] Provider Note   CSN: [id number] Arrival date & time: 07/01/22  7829     History  Chief Complaint  Patient presents with   Sore Throat   Headache    [name] is a 22 y.o. male.  22 year old male presents with complaint of sore throat.  Patient reports onset of symptoms over the last 48 hours.  He denies associated fever.  He is taking intermittent Tylenol at home with minimal improvement of symptoms.  Denies cough or congestion.  He complains of associated body aches and fatigue.  Notably, patient has a 73-year-old son at home.  This child is in daycare.  Patient with mild URI symptoms over the weekend per report.  Patient otherwise without known contact or exposure to strep throat.  The history is provided by the patient and medical records.       Home Medications Prior to Admission medications   Medication Sig Start Date End Date Taking? Authorizing Provider  acetaminophen (TYLENOL) 325 MG tablet Take 2 tablets (650 mg total) by mouth every 6 (six) hours as needed. 07/15/21   Sloan Leiter, DO  baclofen (LIORESAL) 10 MG tablet Take 1 tablet (10 mg total) by mouth 2 (two) times daily. 07/29/21   Raspet, Noberto Retort, PA-C  ibuprofen (ADVIL) 600 MG tablet Take 1 tablet (600 mg total) by mouth every 6 (six) hours as needed. 07/15/21   Sloan Leiter, DO  meloxicam (MOBIC) 15 MG tablet Take 1 tablet (15 mg total) by mouth daily. 08/07/21   Hudnall, Azucena Fallen, MD  methocarbamol (ROBAXIN) 500 MG tablet Take 1 tablet (500 mg total) by mouth every 8 (eight) hours as needed for muscle spasms. 08/07/21   Lenda Kelp, MD  methylPREDNISolone (MEDROL DOSEPAK) 4 MG TBPK tablet Please take as directed on package 06/25/22   Radford Pax, NP  ondansetron (ZOFRAN-ODT) 4 MG disintegrating tablet Take 1 tablet (4 mg total) by mouth every 8 (eight) hours as needed. 12/04/21   Tomi Bamberger, PA-C  promethazine (PHENERGAN) 25 MG tablet Take 1  tablet (25 mg total) by mouth every 6 (six) hours as needed for nausea or vomiting. Patient not taking: Reported on 05/26/2019 04/01/19 05/24/20  Gilda Crease, MD      Allergies    Patient has no known allergies.    Review of Systems   Review of Systems  Constitutional:  Negative for chills and fever.  HENT:  Negative for ear pain and sore throat.   Eyes:  Negative for pain and visual disturbance.  Respiratory:  Negative for cough and shortness of breath.   Cardiovascular:  Negative for chest pain and palpitations.  Gastrointestinal:  Negative for abdominal pain and vomiting.  Genitourinary:  Negative for dysuria and hematuria.  Musculoskeletal:  Negative for arthralgias and back pain.  Skin:  Negative for color change and rash.  Neurological:  Negative for seizures and syncope.  All other systems reviewed and are negative.   Physical Exam Updated Vital Signs BP 132/82 (BP Location: Left Arm)   Pulse 69   Temp 99.4 F (37.4 C) (Oral)   Resp 18   Ht 5\' 9"  (1.753 m)   Wt 81 kg   SpO2 96%   BMI 26.37 kg/m  Physical Exam Vitals and nursing note reviewed.  Constitutional:      General: He is not in acute distress.    Appearance: Normal appearance. He is well-developed.  HENT:  Head: Normocephalic and atraumatic.     Mouth/Throat:     Mouth: Mucous membranes are moist.     Pharynx: Uvula midline.     Comments: Mild erythema of the posterior pharynx.  No evidence of PTA.  Uvula midline.  Tonsils without exudates. Eyes:     Conjunctiva/sclera: Conjunctivae normal.     Pupils: Pupils are equal, round, and reactive to light.  Cardiovascular:     Rate and Rhythm: Normal rate and regular rhythm.     Heart sounds: Normal heart sounds.  Pulmonary:     Effort: Pulmonary effort is normal. No respiratory distress.     Breath sounds: Normal breath sounds.  Abdominal:     General: There is no distension.     Palpations: Abdomen is soft.     Tenderness: There is no  abdominal tenderness.  Musculoskeletal:        General: No deformity. Normal range of motion.     Cervical back: Normal range of motion and neck supple.  Skin:    General: Skin is warm and dry.  Neurological:     General: No focal deficit present.     Mental Status: He is alert and oriented to person, place, and time.     ED Results / Procedures / Treatments   Labs (all labs ordered are listed, but only abnormal results are displayed) Labs Reviewed  RESP PANEL BY RT-PCR (RSV, FLU A&B, COVID)  [id number]  GROUP A STREP BY PCR    EKG None  Radiology DG Hand Complete Left  Result Date: 06/29/2022 CLINICAL DATA:  Injured ring finger under the car [place]. EXAM: LEFT HAND - COMPLETE 3+ VIEW COMPARISON:  None Available. FINDINGS: There is no evidence of fracture or dislocation. There is no evidence of arthropathy or other focal bone abnormality. Soft tissues swelling about the base of the fourth digit. IMPRESSION: Soft tissue swelling about the base of the fourth digit without evidence of fracture or dislocation. Electronically Signed   By: Larose Hires D.O.   On: 06/29/2022 22:02    Procedures Procedures    Medications Ordered in ED Medications  ibuprofen (ADVIL) tablet 800 mg (800 mg Oral Given 07/01/22 1020)  acetaminophen (TYLENOL) tablet 1,000 mg (1,000 mg Oral Given 07/01/22 1020)    ED Course/ Medical Decision Making/ A&P                             Medical Decision Making Risk OTC drugs. Prescription drug management.    Medical Screen Complete  This patient presented to the ED with complaint of sore,.  This complaint involves an extensive number of treatment options. The initial differential diagnosis includes, but is not limited to, viral versus bacterial pharyngitis  This presentation is: Acute, Self-Limited, Previously Undiagnosed, and Uncertain Prognosis  Patient is presenting with complaint of sore throat.  Presentation is most consistent with viral  pharyngitis.    Strep test is negative.  Viral testing for flu, RSV, COVID is negative.  Patient feels improved after ED evaluation and treatment.  He understands need for close outpatient follow-up.  Strict return precautions given and understood.   Additional history obtained: External records from outside sources obtained and reviewed including prior ED visits and prior Inpatient records.    Lab Tests:  I ordered and personally interpreted labs.  T Medicines ordered:  I ordered medication including Tylenol, Motrin for body aches Reevaluation of the patient after these medicines showed  that the patient: improved   Problem List / ED Course:  Viral pharyngitis   Reevaluation:  After the interventions noted above, I reevaluated the patient and found that they have: improved   Disposition:  After consideration of the diagnostic results and the patients response to treatment, I feel that the patent would benefit from close outpatient follow-up.          Final Clinical Impression(s) / ED Diagnoses Final diagnoses:  Pharyngitis, unspecified etiology    Rx / DC Orders ED Discharge Orders     None         Wynetta Fines, MD 07/01/22 1145

## 2022-07-01 NOTE — ED Triage Notes (Signed)
C/o bodyaches, sore throat, productive cough, and headache x2 days.

## 2022-07-01 NOTE — Discharge Instructions (Signed)
Return for any problem.  ?

## 2022-08-19 ENCOUNTER — Ambulatory Visit
Admission: EM | Admit: 2022-08-19 | Discharge: 2022-08-19 | Disposition: A | Discharge status: Home / Self Care | Payer: MEDICAID | Attending: Internal Medicine | Admitting: Internal Medicine

## 2022-08-19 DIAGNOSIS — H60392 Other infective otitis externa, left ear: Secondary | ICD-10-CM | POA: Diagnosis not present

## 2022-08-19 DIAGNOSIS — H6690 Otitis media, unspecified, unspecified ear: Secondary | ICD-10-CM

## 2022-08-19 MED ORDER — AMOXICILLIN-POT CLAVULANATE 875-125 MG PO TABS: 1.0000 | ORAL_TABLET | Freq: Two times a day (BID) | ORAL | 0 refills | Status: DC

## 2022-08-19 MED ORDER — NEOMYCIN-POLYMYXIN-HC 3.5-10000-1 OT SUSP: 4.0000 [drp] | Freq: Three times a day (TID) | OTIC | 0 refills | Status: DC

## 2022-08-19 MED ORDER — BUPROPION HCL ER (XL) 150 MG PO TB24: 150.0000 mg | ORAL_TABLET | Freq: Every day | ORAL | 0 refills | Status: DC

## 2022-08-19 NOTE — ED Provider Notes (Signed)
[facility]    CSN: [id number] Arrival date & time: 08/19/22  1435      History   Chief Complaint Chief Complaint  Patient presents with   Otalgia    HPI [name] is a 22 y.o. male who presents with L ear pain and can't hear from it x 1 week. Has not had a URI or [place] a [place].     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 No pertinent surgical history.     Home Medications    Prior to Admission medications   Medication Sig Start Date End Date Taking? Authorizing Provider  amoxicillin-clavulanate (AUGMENTIN) 875-125 MG tablet Take 1 tablet by mouth every 12 (twelve) hours. 08/19/22  Yes Rodriguez-Southworth, Nettie Elm, PA-C  buPROPion (WELLBUTRIN XL) 150 MG 24 hr tablet Take 1 tablet (150 mg total) by mouth daily. 08/19/22  Yes Rodriguez-Southworth, Nettie Elm, PA-C  neomycin-polymyxin-hydrocortisone (CORTISPORIN) 3.5-10000-1 OTIC suspension Place 4 drops into the left ear 3 (three) times daily. For 7 days 08/19/22  Yes Rodriguez-Southworth, Nettie Elm, PA-C  promethazine (PHENERGAN) 25 MG tablet Take 1 tablet (25 mg total) by mouth every 6 (six) hours as needed for nausea or vomiting. Patient not taking: Reported on 05/26/2019 04/01/19 05/24/20  Gilda Crease, MD    Family History Family History  Problem Relation Age of  Onset   Healthy Mother    Heart disease Neg Hx     Social History Social History   Tobacco Use   Smoking status: Never   Smokeless tobacco: Former   Tobacco comments:    mom smokes outside   Advertising account planner   Vaping status: Former  Substance Use Topics   Alcohol use: Yes    Comment: Socially   Drug use: Not Currently    Types: Marijuana     Allergies   Patient has no known allergies.   Review of Systems Review of Systems  Constitutional:  Positive for diaphoresis. Negative for fever.  HENT:  Positive for ear pain. Negative for congestion, ear discharge, rhinorrhea and sore throat.   Respiratory:  Negative for cough.   Hematological:  Negative for adenopathy.     Physical Exam Triage Vital Signs ED Triage Vitals  Encounter Vitals Group     BP 08/19/22 1441 121/66     Systolic BP Percentile --      Diastolic BP Percentile --      Pulse Rate 08/19/22 1441 77     Resp 08/19/22 1441 16     Temp 08/19/22 1441 98.6 F (37 C)     Temp Source 08/19/22 1441 Oral     SpO2 08/19/22 1441 96 %     Weight --      Height --      Head Circumference --      Peak Flow --  Pain Score 08/19/22 1443 10     Pain Loc --      Pain Education --      Exclude from Growth Chart --    No data found.  Updated Vital Signs BP 121/66 (BP Location: Left Arm)   Pulse 77   Temp 98.6 F (37 C) (Oral)   Resp 16   SpO2 96%   Visual Acuity Right Eye Distance:   Left Eye Distance:   Bilateral Distance:    Right Eye Near:   Left Eye Near:    Bilateral Near:     Physical Exam Vitals and nursing note reviewed.  Constitutional:      General: He is not in acute distress.    Appearance: He is normal weight. He is not ill-appearing or toxic-appearing.  HENT:     Right Ear: Tympanic membrane, ear canal and external ear normal.     Left Ear: External ear normal. Tenderness present. No drainage. Tympanic membrane is injected and bulging.     Ears:     Comments: Has pain with external  ear motion Musculoskeletal:     Cervical back: Neck supple.  Lymphadenopathy:     Cervical: Cervical adenopathy present.  Neurological:     Mental Status: He is alert.      UC Treatments / Results  Labs (all labs ordered are listed, but only abnormal results are displayed) Labs Reviewed - No data to display  EKG   Radiology No results found.  Procedures Procedures (including critical care time)  Medications Ordered in UC Medications - No data to display  Initial Impression / Assessment and Plan / UC Course  I have reviewed the triage vital signs and the nursing notes.  ACUTE OM AND OE  I PLACED HIM  on Augmentin and Cortisporin otic as noted Final Clinical Impressions(s) / UC Diagnoses   Final diagnoses:  Acute otitis media, unspecified otitis media type  Infective otitis externa of left ear   Discharge Instructions   None    ED Prescriptions     Medication Sig Dispense Auth. Provider   buPROPion (WELLBUTRIN XL) 150 MG 24 hr tablet Take 1 tablet (150 mg total) by mouth daily. 1 tablet Rodriguez-Southworth, Nettie Elm, PA-C   amoxicillin-clavulanate (AUGMENTIN) 875-125 MG tablet Take 1 tablet by mouth every 12 (twelve) hours. 14 tablet Rodriguez-Southworth, [place] Borchardt, PA-C   neomycin-polymyxin-hydrocortisone (CORTISPORIN) 3.5-10000-1 OTIC suspension Place 4 drops into the left ear 3 (three) times daily. For 7 days 10 mL Rodriguez-Southworth, Nettie Elm, PA-C      PDMP not reviewed this encounter.   Garey Ham, PA-C 08/19/22 1501

## 2022-08-19 NOTE — ED Triage Notes (Signed)
Pt reports left ear pain x 1 week. Tylenol gives no relief.

## 2022-10-05 ENCOUNTER — Other Ambulatory Visit: Payer: Self-pay

## 2022-10-05 ENCOUNTER — Emergency Department (HOSPITAL_BASED_OUTPATIENT_CLINIC_OR_DEPARTMENT_OTHER)
Admission: EM | Admit: 2022-10-05 | Discharge: 2022-10-06 | Disposition: A | Discharge status: Home / Self Care | Payer: MEDICAID | Attending: Emergency Medicine | Admitting: Emergency Medicine

## 2022-10-05 ENCOUNTER — Emergency Department (HOSPITAL_BASED_OUTPATIENT_CLINIC_OR_DEPARTMENT_OTHER): Payer: MEDICAID

## 2022-10-05 DIAGNOSIS — R200 Anesthesia of skin: Secondary | ICD-10-CM | POA: Insufficient documentation

## 2022-10-05 LAB — BASIC METABOLIC PANEL
Anion gap: 10 (ref 5–15)
BUN: 13 mg/dL (ref 6–20)
CO2: 24 mmol/L (ref 22–32)
Calcium: 8.7 mg/dL — ABNORMAL LOW (ref 8.9–10.3)
Chloride: 104 mmol/L (ref 98–111)
Creatinine, Ser: 1.26 mg/dL — ABNORMAL HIGH (ref 0.61–1.24)
GFR, Estimated: >60 mL/min (ref >60)
Glucose, Bld: 103 mg/dL — ABNORMAL HIGH (ref 70–99)
Potassium: 3.9 mmol/L (ref 3.5–5.1)
Sodium: 138 mmol/L (ref 135–145)

## 2022-10-05 LAB — CBC WITH DIFFERENTIAL/PLATELET
Abs Immature Granulocytes: 0.01 10*3/uL (ref 0.00–0.07)
Basophils Absolute: 0.1 10*3/uL (ref 0.0–0.1)
Basophils Relative: 1 %
Eosinophils Absolute: 0.3 10*3/uL (ref 0.0–0.5)
Eosinophils Relative: 6 %
HCT: 42.7 % (ref 39.0–52.0)
Hemoglobin: 15.1 g/dL (ref 13.0–17.0)
Immature Granulocytes: 0 %
Lymphocytes Relative: 50 %
Lymphs Abs: 2.6 10*3/uL (ref 0.7–4.0)
MCH: 32.1 pg (ref 26.0–34.0)
MCHC: 35.4 g/dL (ref 30.0–36.0)
MCV: 90.7 fL (ref 80.0–100.0)
Monocytes Absolute: 0.4 10*3/uL (ref 0.1–1.0)
Monocytes Relative: 8 %
Neutro Abs: 1.8 10*3/uL (ref 1.7–7.7)
Neutrophils Relative %: 35 %
Platelets: 250 10*3/uL (ref 150–400)
RBC: 4.71 MIL/uL (ref 4.22–5.81)
RDW: 11.5 % (ref 11.5–15.5)
WBC: 5.1 10*3/uL (ref 4.0–10.5)
nRBC: 0 % (ref 0.0–0.2)

## 2022-10-05 LAB — PROTIME-INR
INR: 1 (ref 0.8–1.2)
Prothrombin Time: 13.2 s (ref 11.4–15.2)

## 2022-10-05 LAB — CBG MONITORING, ED: Glucose-Capillary: 116 mg/dL — ABNORMAL HIGH (ref 70–99)

## 2022-10-05 NOTE — ED Provider Notes (Signed)
[place] [facility] AT [facility] Provider Note   CSN: [id number] Arrival date & time: 10/05/22  2110     History  Chief Complaint  Patient presents with   Numbness    [name] is a 22 y.o. male.  Patient here with numbness to the right side of his arm and leg the past week.  Difficulty with speech.  History of bipolar and depression.  Denies any drug use.  No chest pain shortness of breath neck pain headaches.  He did have a migraine for a day or 2 after numbness and speech issues had already started but that resolved.  He denies any recent illness.  No history of MS in the family or other neuromuscular disease.  He is not having any weakness or difficulty walking but he is having the chronic numbness and speech issues at times.  He denies any nausea vomiting diarrhea.  The history is provided by the patient.       Home Medications Prior to Admission medications   Medication Sig Start Date End Date Taking? Authorizing Provider  amoxicillin-clavulanate (AUGMENTIN) 875-125 MG tablet Take 1 tablet by mouth every 12 (twelve) hours. 08/19/22   Rodriguez-Southworth, Nettie Elm, PA-C  buPROPion (WELLBUTRIN XL) 150 MG 24 hr tablet Take 1 tablet (150 mg total) by mouth daily. 08/19/22   Rodriguez-Southworth, Nettie Elm, PA-C  neomycin-polymyxin-hydrocortisone (CORTISPORIN) 3.5-10000-1 OTIC suspension Place 4 drops into the left ear 3 (three) times daily. For 7 days 08/19/22   Rodriguez-Southworth, Nettie Elm, PA-C  promethazine (PHENERGAN) 25 MG tablet Take 1 tablet (25 mg total) by mouth every 6 (six) hours as needed for nausea or vomiting. Patient not taking: Reported on 05/26/2019 04/01/19 05/24/20  Gilda Crease, MD      Allergies    Patient has no known allergies.    Review of Systems   Review of Systems  Physical Exam Updated Vital Signs BP (!) 132/98 (BP Location: Right Arm)   Pulse 77   Temp 98 F (36.7 C)   Resp 18   Ht 5\' 8"  (1.727 m)   Wt 84.4  kg   SpO2 98%   BMI 28.28 kg/m  Physical Exam Vitals and nursing note reviewed.  Constitutional:      General: He is not in acute distress.    Appearance: He is well-developed. He is not ill-appearing.  HENT:     Head: Normocephalic and atraumatic.     Nose: Nose normal.     Mouth/Throat:     Mouth: Mucous membranes are moist.  Eyes:     Extraocular Movements: Extraocular movements intact.     Conjunctiva/sclera: Conjunctivae normal.     Pupils: Pupils are equal, round, and reactive to light.  Cardiovascular:     Rate and Rhythm: Normal rate and regular rhythm.     Pulses: Normal pulses.     Heart sounds: Normal heart sounds. No murmur heard. Pulmonary:     Effort: Pulmonary effort is normal. No respiratory distress.     Breath sounds: Normal breath sounds.  Abdominal:     Palpations: Abdomen is soft.     Tenderness: There is no abdominal tenderness.  Musculoskeletal:        General: No swelling. Normal range of motion.     Cervical back: Normal range of motion and neck supple.  Skin:    General: Skin is warm and dry.     Capillary Refill: Capillary refill takes less than 2 seconds.  Neurological:  General: No focal deficit present.     Mental Status: He is alert.     Sensory: Sensory deficit present.     Motor: No weakness.     Comments: Numbness to the right arm, right leg, stuttering speech but no obvious aphasia, strength is 5+ out of 5 throughout, normal visual fields, normal finger-nose-finger, normal gait  Psychiatric:        Mood and Affect: Mood normal.     ED Results / Procedures / Treatments   Labs (all labs ordered are listed, but only abnormal results are displayed) Labs Reviewed  BASIC METABOLIC PANEL - Abnormal; Notable for the following components:      Result Value   Glucose, Bld 103 (*)    Creatinine, Ser 1.26 (*)    Calcium 8.7 (*)    All other components within normal limits  CBG MONITORING, ED - Abnormal; Notable for the following  components:   Glucose-Capillary 116 (*)    All other components within normal limits  CBC WITH DIFFERENTIAL/PLATELET  PROTIME-INR    EKG None  Radiology CT Head Wo Contrast  Result Date: 10/05/2022 CLINICAL DATA:  Headache. EXAM: CT HEAD WITHOUT CONTRAST TECHNIQUE: Contiguous axial images were obtained from the base of the skull through the vertex without intravenous contrast. RADIATION DOSE REDUCTION: This exam was performed according to the departmental dose-optimization program which includes automated exposure control, adjustment of the mA and/or kV according to patient size and/or use of iterative reconstruction technique. COMPARISON:  07/15/2021 FINDINGS: Brain: No acute intracranial hemorrhage. No focal mass lesion. No CT evidence of acute infarction. No midline shift or mass effect. No hydrocephalus. Basilar cisterns are patent. Vascular: No hyperdense vessel or unexpected calcification. Skull: Normal. Negative for fracture or focal lesion. Sinuses/Orbits: Paranasal sinuses and mastoid air cells are clear. Orbits are clear. Other: None. IMPRESSION: Normal head CT. Electronically Signed   By: Genevive Bi M.D.   On: 10/05/2022 22:02    Procedures Procedures    Medications Ordered in ED Medications - No data to display  ED Course/ Medical Decision Making/ A&P                                 Medical Decision Making Amount and/or Complexity of Data Reviewed Radiology: ordered.   [name] is here with difficulty with speech, numbness to the right side of his arm and leg for the last week.  He has been having intermittent difficulty getting words out.  He has been having constant numbness to his right arm and leg for the past week.  He has no significant medical problems other than history of bipolar and depression.  He denies any headaches.  Denies any drug use.  Denies any neck pain or headache or abdominal pain chest pain or shortness of breath.  Denies any vision  changes.  He is well-appearing on exam.  He has subjective numbness to his right arm and right leg.  He does stutter a little bit when he speaks but no obvious aphasia.  He is well-appearing.  Basic labs were collected and are unremarkable per my review and interpretation.  No significant anemia, electrolyte abnormality, kidney injury or leukocytosis.  Head CT was unremarkable per radiology report.  He has no history of MS in the family.  Overall I have low suspicion for stroke or MS or infectious process.  But I did talk with Dr. Amada Jupiter with neurology who thought  to be reasonable to get MRI with and without contrast of the brain and cervical spine to rule out stroke, MS and other processes including mass.  He states that if images are unremarkable he can follow-up outpatient with neurology.  Patient is okay with this plan.  He will be transferred personal vehicle by family to [facility] for these MRIs.  Overall he appears well.  Transferred in good condition.  This chart was dictated using voice recognition software.  Despite best efforts to proofread,  errors can occur which can change the documentation meaning.         Final Clinical Impression(s) / ED Diagnoses Final diagnoses:  Numbness    Rx / DC Orders ED Discharge Orders     None         Virgina Norfolk, DO 10/05/22 2358

## 2022-10-05 NOTE — ED Notes (Addendum)
Per EDP, ok to ride via POV with wife to [facility] ED for MRI. EDP requests IV placed prior to transport. Done and secured with coban. Report given to [facility]. Ambulatory, NAD, A&Ox4.

## 2022-10-05 NOTE — ED Triage Notes (Signed)
Pt to ED c/o difficulty word finding x 1 week and numbness to right arm and right leg x 1 week, ambulatory in triage , no decrease in sensation. Reports constant in nature. 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 No s/s of acute distress noted in triage A&O X 4, scrolling/texting on phone without difficulty.

## 2022-10-06 NOTE — ED Provider Notes (Signed)
Patient sent to [facility] from free standing ED for MRI due to numbness and speech change.  He states that he doesn't want to wait for the MRI.  He requests to be discharged.  I'll place ambulatory referral for neurology follow-up.  Return precautions discussed.  He has capacity to make his own medical decisions.  We discussed that further workup is still needed.  He'd like to do this outpatient.   Roxy Horseman, PA-C 10/06/22 0132    Gilda Crease, MD 10/08/22 [phone]

## 2022-10-06 NOTE — Discharge Instructions (Addendum)
Please follow-up with the neurology office.  The office should call you to schedule an appointment.  If your symptoms change or worsen, please return to the ER.

## 2022-10-08 ENCOUNTER — Encounter: Payer: Self-pay | Admitting: Neurology

## 2022-10-08 ENCOUNTER — Ambulatory Visit (INDEPENDENT_AMBULATORY_CARE_PROVIDER_SITE_OTHER): Payer: MEDICAID | Admitting: Neurology

## 2022-10-08 VITALS — BP 118/70 | Ht 68.0 in | Wt 185.0 lb

## 2022-10-08 DIAGNOSIS — R4789 Other speech disturbances: Secondary | ICD-10-CM

## 2022-10-08 DIAGNOSIS — R471 Dysarthria and anarthria: Secondary | ICD-10-CM

## 2022-10-08 DIAGNOSIS — R200 Anesthesia of skin: Secondary | ICD-10-CM

## 2022-10-08 DIAGNOSIS — F8081 Childhood onset fluency disorder: Secondary | ICD-10-CM

## 2022-10-08 NOTE — Patient Instructions (Signed)
MRI Brain with and without contrast  MRI Cervical spine with and without contrast  Will contact you to go over thew results
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
## 2022-10-09 ENCOUNTER — Telehealth: Payer: Self-pay | Admitting: Neurology

## 2022-10-09 NOTE — Telephone Encounter (Signed)
sent to [facility] [phone] [facility] [employer] [facility], with [facility] guidance is waiving prior authorization for the dates of service from August 04, 2022, through November 03, 2022. Prior authorization will be required for all dates of service on November 04, 2022, and thereafter. [employer] will be available to begin providing prior authorizations for those services starting on October 27, 2022, for dates of service November 04, 2022, and after.

## 2022-10-15 ENCOUNTER — Other Ambulatory Visit: Payer: Self-pay

## 2022-10-15 ENCOUNTER — Ambulatory Visit: Payer: MEDICAID | Attending: Neurology | Admitting: Speech Pathology

## 2022-10-15 ENCOUNTER — Encounter: Payer: Self-pay | Admitting: Speech Pathology

## 2022-10-15 DIAGNOSIS — F8081 Childhood onset fluency disorder: Secondary | ICD-10-CM | POA: Diagnosis not present

## 2022-10-15 DIAGNOSIS — R471 Dysarthria and anarthria: Secondary | ICD-10-CM | POA: Insufficient documentation

## 2022-10-15 DIAGNOSIS — R4701 Aphasia: Secondary | ICD-10-CM | POA: Insufficient documentation

## 2022-10-15 DIAGNOSIS — R4789 Other speech disturbances: Secondary | ICD-10-CM | POA: Diagnosis not present

## 2022-10-15 NOTE — Therapy (Signed)
OUTPATIENT SPEECH LANGUAGE PATHOLOGY APHASIA EVALUATION   Patient Name: [name] MRN: [id number] DOB:[birth year], 22 y.o., male Today's Date: 10/15/2022  PCP: Francee Piccolo NP REFERRING PROVIDER: Windell Norfolk MD  END OF SESSION:  End of Session - 10/15/22 1511     Visit Number 1    Number of Visits 17    Date for SLP Re-Evaluation 12/10/22    Authorization Type [employer] - request auth    SLP Start Time 1400    SLP Stop Time  1445    SLP Time Calculation (min) 45 min    Activity Tolerance Patient tolerated treatment well             Past Medical History:  Diagnosis Date   Bipolar 1 disorder (HCC)    Chlamydia infection 06/03/2016   4/26 pos test, 5/1 treated   Depression    History reviewed. No pertinent surgical history. Patient Active Problem List   Diagnosis Date Noted   Anxiety state 08/24/2019   History of gonorrhea 02/03/2019   Severe recurrent major depression with psychotic features (HCC) 09/25/2018   Delusional disorder (HCC) 09/25/2018   Syncope 01/07/2018   Chest wall pain 01/07/2018   Poor diet 01/07/2018   Allergic rhinitis 08/05/2017   Abnormal hearing screen 08/05/2017   History of hallucinations 07/01/2017   -chronic right-sided headaches- concerning for migraines 05/29/2016   Acne vulgaris 05/29/2016    ONSET DATE: 10/01/22   REFERRING DIAG: R47.89 (ICD-10-CM) - Word finding difficulty R47.1 (ICD-10-CM) - Dysarthria F80.81 (ICD-10-CM) - Stuttering  THERAPY DIAG:  Aphasia  Rationale for Evaluation and Treatment: Rehabilitation  SUBJECTIVE:   SUBJECTIVE STATEMENT: "I know what I want to say but it don't come out" Pt accompanied by: self  PERTINENT HISTORY: This is a 22 year old gentleman with past medical history of bipolar disorder who is presenting with complaint of right-sided numbness, word finding difficulty since October 01 2022.  Patient reports episodes started all of a sudden, could not identify any precipitating  factors.  He was having word finding difficulty, stuttering, and slurred speech.  On September 2 he started having right sided numbness involving the right arm and right leg.  He got concerned and presented to [facility] ED.  In the ED, had a normal head CT and was sent to [facility] for MRI.  While waiting for the MRI, he was told that it will take several hours and he did not have childcare, therefore he left AMA.  He reports his symptom are the same, have not gotten worse or better.  PAIN:  Are you having pain? No  FALLS: Has patient fallen in last 6 months?  No  LIVING ENVIRONMENT: Lives with: lives with their family Lives in: House/apartment  PLOF:  Level of assistance: Independent with ADLs, Independent with IADLs Employment: Full-time employment  PATIENT GOALS: "To get better"  OBJECTIVE:    COGNITION: Overall cognitive status: Within functional limits for tasks assessed Denies memory changes  AUDITORY COMPREHENSION: Overall auditory comprehension: Impaired: simple YES/NO questions: Impaired: moderately complex Following directions: Impaired: moderately complex Conversation: Simple Interfering components: n/a Effective technique: extra processing time, pausing, repetition/stressing words, slowed speech, stressing words, and visual/gestural cues  READING COMPREHENSION: Impaired: sentence and paragraph  EXPRESSION: verbal  VERBAL EXPRESSION: Level of generative/spontaneous verbalization: conversation Automatic speech: WNL  Repetition: Impaired: sentence Naming: Confrontation: 76-100% and Divergent: 51-75% Pragmatics: Appears intact Comments:  Interfering components: N/A Effective technique: semantic cues and sentence completion Non-verbal means of communication: N/A  WRITTEN EXPRESSION: Dominant hand:  right Written expression: Impaired: phrase  MOTOR SPEECH: Overall motor speech: impaired Level of impairment:  Respiration: thoracic breathing Phonation:  normal Resonance: WFL Articulation: Appears intact Intelligibility: Intelligible Motor planning: Impaired: groping for words Motor speech errors: aware Interfering components:  N/A Effective technique: slow rate  ORAL MOTOR EXAMINATION: Overall status: WFL Comments:   STANDARDIZED ASSESSMENTS: QAB: Moderate 5.51  PATIENT REPORTED OUTCOME MEASURES (PROM): Communication Participation Item Bank: 4/30 - [name] rated a 0 or "very much" difficulty communicating quickly, speaking with strangers, asking questions, communicating in a group, giving detailed information, getting a turn in conversation. He rated a 1, or "quite a bit" of difficulty talking with familiar people, communicating in the community, having a long conversation, giving details   TODAY'S TREATMENT:                                                                                                                                         DATE:   10/15/22 (eval day): Introduced Careers adviser for word finding. Educated re: compensations for auditory comprehension. See patient instructions.   PATIENT EDUCATION: Education details: See Today's Treatment; compensations for aphasia; see Patient Instructions Person educated: Patient Education method: Explanation, Demonstration, Verbal cues, and Handouts Education comprehension: verbal cues required and needs further education   GOALS: Goals reviewed with patient? Yes  SHORT TERM GOALS: Target date: 11/12/22  Pt will name 15 items in personally relevant category with rare min A Baseline: 10 Goal status: INITIAL  2.  Pt will form complex sentences in structured task with rare min A Baseline: simple sentences only Goal status: INITIAL  3.  Pt will employ verbal compensations for word finding in structured language tasks as needed with occasional min A Baseline: aborting utterance, no compensations, says "forget it" Goal status: INITIAL  4.  Pt will comprehend 3  sentence paragraph with occasional min A Baseline: 1 sentence  Goal status: INITIAL  5.  Pt will write 3 sentence text note correcting mistakes with occasional min A Baseline: 1 simple sentence, assistance to ID and correct errors Goal status: INITIAL  6.  Pt will carryover 3 strategies to improve auditory comprehension with occasional min A Baseline: no strategies; unaware he is not understanding Goal status: INITIAL  LONG TERM GOALS: Target date: 12/10/22  Pt will carryover compensatory strategies for word finding in conversation 4/5 opportunities with rare min A Baseline: no strategies Goal status: INITIAL  2.  Pt will carryover 3 strategies to compensate for impaired auditory comprehension at work with rare min A Baseline: no strategies Goal status: INITIAL  3.  Pt will carryover compensatory strategy to comprehend measurements and cut wood/pipes correct measurement over 1 week Baseline: not reading measurements; cutting wrong sizes at work Goal status: INITIAL  4.  Pt will comprehend 5 sentence paragraph (work related) with occasional min A Baseline: 1 sentence, fiance is reading for him Goal status: INITIAL  5.  Pt will improve score on Communicative Participation Item Bank by 4 points Baseline: 4/30 Goal status: INITIAL   ASSESSMENT:  CLINICAL IMPRESSION: Patient is a 22 y.o. male who was seen today for aphasia. MRI pending 10/18/22. [name] had episode of right UE weakness and difficulty speaking. He waited several days before going to the ED. Prior to incident he was working full time, completing IADLs and parenting young children with his fiance. He reports difficulty talking, reading, writing and understanding. He gets frustrated when he can't get words out and quits talking. He has had disagreements at home and work due to mis understandings from poor auditory comprehension. 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 He got a paragraph text from his boss and was unable to read it so he had his partner read it to him when he got home. Texting and writing are impaired, so [name] is has attempted speech to text to respond to texts with limited success. Today he was unaware of aphasic errors in written sentences. [name] is at risk of having aphasia affect his success at work and his ability to maintain employment. I recommend skilled ST to maximize communication for success at work, success parenting, safety and independence. .   OBJECTIVE IMPAIRMENTS: include aphasia. These impairments are limiting patient from return to work, Clinical cytogeneticist finances, household responsibilities, ADLs/IADLs, and effectively communicating at home and in community. Factors affecting potential to achieve goals and functional outcome are  n/a . Patient will benefit from skilled SLP services to address above impairments and improve overall function.  REHAB POTENTIAL: Good  PLAN:  SLP FREQUENCY: 2x/week  SLP DURATION: 8 weeks - will request 15 visits from medicaid  PLANNED INTERVENTIONS: Language facilitation, Environmental controls, Cueing hierachy, Cognitive reorganization, Internal/external aids, Functional tasks, Multimodal communication approach, SLP instruction and feedback, Compensatory strategies, and Patient/family education    Alice Reichert Radene Journey, CCC-SLP 10/15/2022, 3:33 PM

## 2022-10-15 NOTE — Patient Instructions (Addendum)
  Aphasia is a language impairment. It affect:  Listening and understanding Reading  Speaking  Writing  You are having difficulty understanding reading and talking. When someone needs to tell  you something important:    Get the persons attention before you speak  Use eye contact and face the person you are speaking to  Be in close proximity to the person you are speaking to  Turn down any noise in the environment such as the TV, walk away from loud appliances, air conditioners, fans, dish washers etc  Repeat back what has been said to make sure you got it all  Get information in writing - "text that to me"  Have someone with you when you go to the doctor or talk to insurance  This can cause misunderstandings or fights  You look normal, so people will forget that you are having trouble  Give clues when you can't think of a word

## 2022-10-18 ENCOUNTER — Ambulatory Visit (HOSPITAL_COMMUNITY)
Admission: RE | Admit: 2022-10-18 | Discharge: 2022-10-18 | Disposition: A | Discharge status: Home / Self Care | Payer: MEDICAID | Source: Ambulatory Visit | Attending: Neurology | Admitting: Neurology

## 2022-10-18 DIAGNOSIS — R4789 Other speech disturbances: Secondary | ICD-10-CM | POA: Diagnosis present

## 2022-10-18 DIAGNOSIS — R200 Anesthesia of skin: Secondary | ICD-10-CM

## 2022-10-18 MED ORDER — GADOBUTROL 1 MMOL/ML IV SOLN
8.0000 mL | Freq: Once | INTRAVENOUS | Status: AC | PRN
  Administered 2022-10-18: 8 mL via INTRAVENOUS

## 2022-10-21 ENCOUNTER — Ambulatory Visit: Payer: MEDICAID

## 2022-10-21 DIAGNOSIS — R4701 Aphasia: Secondary | ICD-10-CM

## 2022-10-21 NOTE — Patient Instructions (Addendum)
Reading: Practice reading slowly for about 5 mins today If you have an error, stop and try to fix it  Read it multiple (2-3x) in order to understand it   Talking: Fix errors when it happens  Ask for repetition or clarification if needed

## 2022-10-21 NOTE — Therapy (Signed)
OUTPATIENT SPEECH LANGUAGE PATHOLOGY APHASIA TREATMENT   Patient Name: [name] MRN: [id number] DOB:Oct 25, 2000, 22 y.o., male Today's Date: 10/21/2022  PCP: Francee Piccolo NP REFERRING PROVIDER: Windell Norfolk MD  END OF SESSION:  End of Session - 10/21/22 0929     Visit Number 2    Number of Visits 17    Date for SLP Re-Evaluation 12/10/22    Authorization Type [employer] - request auth    SLP Start Time 0930    SLP Stop Time  1015    SLP Time Calculation (min) 45 min    Activity Tolerance Patient tolerated treatment well              Past Medical History:  Diagnosis Date   Bipolar 1 disorder (HCC)    Chlamydia infection 06/03/2016   4/26 pos test, 5/1 treated   Depression    History reviewed. No pertinent surgical history. Patient Active Problem List   Diagnosis Date Noted   Anxiety state 08/24/2019   History of gonorrhea 02/03/2019   Severe recurrent major depression with psychotic features (HCC) 09/25/2018   Delusional disorder (HCC) 09/25/2018   Syncope 01/07/2018   Chest wall pain 01/07/2018   Poor diet 01/07/2018   Allergic rhinitis 08/05/2017   Abnormal hearing screen 08/05/2017   History of hallucinations 07/01/2017   -chronic right-sided headaches- concerning for migraines 05/29/2016   Acne vulgaris 05/29/2016    ONSET DATE: 10/01/22   REFERRING DIAG: R47.89 (ICD-10-CM) - Word finding difficulty R47.1 (ICD-10-CM) - Dysarthria F80.81 (ICD-10-CM) - Stuttering  THERAPY DIAG:  Aphasia  Rationale for Evaluation and Treatment: Rehabilitation  SUBJECTIVE:   SUBJECTIVE STATEMENT: "I think it's done gotten a little better"  Pt accompanied by: self  PERTINENT HISTORY: This is a 22 year old gentleman with past medical history of bipolar disorder who is presenting with complaint of right-sided numbness, word finding difficulty since October 01 2022.  Patient reports episodes started all of a sudden, could not identify any precipitating factors.   He was having word finding difficulty, stuttering, and slurred speech.  On September 2 he started having right sided numbness involving the right arm and right leg.  He got concerned and presented to [facility] ED.  In the ED, had a normal head CT and was sent to [facility] for MRI.  While waiting for the MRI, he was told that it will take several hours and he did not have childcare, therefore he left AMA.  He reports his symptom are the same, have not gotten worse or better.  PAIN:  Are you having pain? No  FALLS: Has patient fallen in last 6 months?  No  LIVING ENVIRONMENT: Lives with: lives with their family Lives in: House/apartment  PLOF:  Level of assistance: Independent with ADLs, Independent with IADLs Employment: Full-time employment  PATIENT GOALS: "To get better"  OBJECTIVE:   AUDITORY COMPREHENSION: Overall auditory comprehension: Impaired: simple YES/NO questions: Impaired: moderately complex Following directions: Impaired: moderately complex Conversation: Simple Interfering components: n/a Effective technique: extra processing time, pausing, repetition/stressing words, slowed speech, stressing words, and visual/gestural cues  READING COMPREHENSION: Impaired: sentence and paragraph  EXPRESSION: verbal  VERBAL EXPRESSION: Level of generative/spontaneous verbalization: conversation Automatic speech: WNL  Repetition: Impaired: sentence Naming: Confrontation: 76-100% and Divergent: 51-75% Pragmatics: Appears intact Comments:  Interfering components: N/A Effective technique: semantic cues and sentence completion Non-verbal means of communication: N/A  WRITTEN EXPRESSION: Dominant hand: right Written expression: Impaired: phrase  MOTOR SPEECH: Overall motor speech: impaired Level of impairment:  Respiration:  thoracic breathing Phonation: normal Resonance: WFL Articulation: Appears intact Intelligibility: Intelligible Motor planning: Impaired: groping for  words Motor speech errors: aware Interfering components:  N/A Effective technique: slow rate  TODAY'S TREATMENT:                                                                                                                                          10/21/22: Reportedly reading novel with benefit of slowing down. Endorsed some difficulty with comprehension, resulting in stopping task. Pt endorsed some improvements in verbal expression since eval; however, unable to provide specific examples. Some intermittent reduced auditory comprehension exhibited in conversation requiring occasional SLP repetition and clarification. 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 Targeted functional naming of 15  mechanical terms. Pt wrote and verbally named related words with mod I. Pt able to describe targeted terms with rare min A. Further assessed reading comprehension via RCBA-2. Noted occasional phonemic errors during oral reading (ex: plam for palm, tables for tablets), with pt able to self-correct 90% of verbal errors at word and sentence levels with extra time. Demonstrated sufficient reading comprehension during structured task, with pt able to self-correct comprehension errors x3 at functional reading level with extra time. Updated HEP to include oral reading with strategies provided in pt instructions.   10/15/22 (eval day): Introduced Careers adviser for word finding. Educated re: compensations for auditory comprehension. See patient instructions.   PATIENT EDUCATION: Education details: See Today's Treatment; compensations for aphasia; see Patient Instructions Person educated: Patient Education method: Explanation, Demonstration, Verbal cues, and Handouts Education comprehension: verbal cues required and needs further education   GOALS: Goals reviewed with patient? Yes  SHORT TERM GOALS: Target date: 11/12/22  Pt will name 15 items in personally relevant category with rare min  A Baseline: 10; 18 Goal status: MET   2.  Pt will form complex sentences in structured task with rare min A Baseline: simple sentences only Goal status: IN PROGRESS  3.  Pt will employ verbal compensations for word finding in structured language tasks as needed with occasional min A Baseline: aborting utterance, no compensations, says "forget it" Goal status: IN PROGRESS  4.  Pt will comprehend 3 sentence paragraph with occasional min A Baseline: 1 sentence  Goal status: IN PROGRESS  5.  Pt will write 3 sentence text note correcting mistakes with occasional min A Baseline: 1 simple sentence, assistance to ID and correct errors Goal status: INITIAL  6.  Pt will carryover 3 strategies to improve auditory comprehension with occasional min A Baseline: no strategies; unaware he is not understanding Goal status: IN PROGRESS  LONG TERM GOALS: Target date: 12/10/22  Pt will carryover compensatory strategies for word finding in conversation 4/5 opportunities with rare min A Baseline: no strategies Goal status: IN PROGRESS  2.  Pt will carryover 3 strategies to compensate for impaired auditory comprehension at work with rare  min A Baseline: no strategies Goal status: IN PROGRESS  3.  Pt will carryover compensatory strategy to comprehend measurements and cut wood/pipes correct measurement over 1 week Baseline: not reading measurements; cutting wrong sizes at work Goal status: IN PROGRESS  4.  Pt will comprehend 5 sentence paragraph (work related) with occasional min A Baseline: 1 sentence, fiance is reading for him Goal status: IN PROGRESS  5.  Pt will improve score on Communicative Participation Item Bank by 4 points Baseline: 4/30 Goal status: IN PROGRESS   ASSESSMENT:  CLINICAL IMPRESSION: Patient is a 22 y.o. male who was seen today for aphasia. MRI pending 10/18/22. Lennin had episode of right UE weakness and difficulty speaking. He waited several days before going to the  ED. Prior to incident he was working full time, completing IADLs and parenting young children with his fiance. He reports difficulty talking, reading, writing and understanding. He gets frustrated when he can't get words out and quits talking. He has had disagreements at home and work due to mis understandings from poor auditory comprehension. 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 He got a paragraph text from his boss and was unable to read it so he had his partner read it to him when he got home. Texting and writing are impaired, so Flor is has attempted speech to text to respond to texts with limited success. Today he was usually aware of aphasic errors in written sentences. Tyrell is at risk of having aphasia affect his success at work and his ability to maintain employment. I recommend skilled ST to maximize communication for success at work, success parenting, safety and independence. .   OBJECTIVE IMPAIRMENTS: include aphasia. These impairments are limiting patient from return to work, Clinical cytogeneticist finances, household responsibilities, ADLs/IADLs, and effectively communicating at home and in community. Factors affecting potential to achieve goals and functional outcome are  n/a . Patient will benefit from skilled SLP services to address above impairments and improve overall function.  REHAB POTENTIAL: Good  PLAN:  SLP FREQUENCY: 2x/week  SLP DURATION: 8 weeks - will request 15 visits from medicaid  PLANNED INTERVENTIONS: Language facilitation, Environmental controls, Cueing hierachy, Cognitive reorganization, Internal/external aids, Functional tasks, Multimodal communication approach, SLP instruction and feedback, Compensatory strategies, and Patient/family education    Gracy Racer, CCC-SLP 10/21/2022, 11:24 AM

## 2022-10-24 ENCOUNTER — Ambulatory Visit: Payer: MEDICAID | Admitting: Speech Pathology

## 2022-10-24 DIAGNOSIS — R4701 Aphasia: Secondary | ICD-10-CM

## 2022-10-24 NOTE — Therapy (Signed)
OUTPATIENT SPEECH LANGUAGE PATHOLOGY APHASIA TREATMENT   Patient Name: [name] MRN: [id number] DOB:2000/04/19, 22 y.o., male Today's Date: 10/24/2022  PCP: Francee Piccolo NP REFERRING PROVIDER: Windell Norfolk MD  END OF SESSION:  End of Session - 10/24/22 0934     Visit Number 3    Number of Visits 17    Date for SLP Re-Evaluation 12/10/22    Authorization Type [employer] medicaid - request auth    SLP Start Time 0930    SLP Stop Time  1015    SLP Time Calculation (min) 45 min    Activity Tolerance Patient tolerated treatment well              Past Medical History:  Diagnosis Date   Bipolar 1 disorder (HCC)    Chlamydia infection 06/03/2016   4/26 pos test, 5/1 treated   Depression    No past surgical history on file. Patient Active Problem List   Diagnosis Date Noted   Anxiety state 08/24/2019   History of gonorrhea 02/03/2019   Severe recurrent major depression with psychotic features (HCC) 09/25/2018   Delusional disorder (HCC) 09/25/2018   Syncope 01/07/2018   Chest wall pain 01/07/2018   Poor diet 01/07/2018   Allergic rhinitis 08/05/2017   Abnormal hearing screen 08/05/2017   History of hallucinations 07/01/2017   -chronic right-sided headaches- concerning for migraines 05/29/2016   Acne vulgaris 05/29/2016    ONSET DATE: 10/01/22   REFERRING DIAG: R47.89 (ICD-10-CM) - Word finding difficulty R47.1 (ICD-10-CM) - Dysarthria F80.81 (ICD-10-CM) - Stuttering  THERAPY DIAG:  Aphasia  Rationale for Evaluation and Treatment: Rehabilitation  SUBJECTIVE:   SUBJECTIVE STATEMENT: "reading is helping. Things are falling into place." Endorses ongoing concern re: cause of challenges.  Pt accompanied by: self  PERTINENT HISTORY: This is a 22 year old gentleman with past medical history of bipolar disorder who is presenting with complaint of right-sided numbness, word finding difficulty since October 01 2022.  Patient reports episodes started all of a sudden,  could not identify any precipitating factors.  He was having word finding difficulty, stuttering, and slurred speech.  On September 2 he started having right sided numbness involving the right arm and right leg.  He got concerned and presented to [facility] ED.  In the ED, had a normal head CT and was sent to [facility] for MRI.  While waiting for the MRI, he was told that it will take several hours and he did not have childcare, therefore he left AMA.  He reports his symptom are the same, have not gotten worse or better.  PAIN:  Are you having pain? No  FALLS: Has patient fallen in last 6 months?  No  LIVING ENVIRONMENT: Lives with: lives with their family Lives in: House/apartment  PLOF:  Level of assistance: Independent with ADLs, Independent with IADLs Employment: Full-time employment  PATIENT GOALS: "To get better"  OBJECTIVE:   AUDITORY COMPREHENSION: Overall auditory comprehension: Impaired: simple YES/NO questions: Impaired: moderately complex Following directions: Impaired: moderately complex Conversation: Simple Interfering components: n/a Effective technique: extra processing time, pausing, repetition/stressing words, slowed speech, stressing words, and visual/gestural cues  READING COMPREHENSION: Impaired: sentence and paragraph  EXPRESSION: verbal  VERBAL EXPRESSION: Level of generative/spontaneous verbalization: conversation Automatic speech: WNL  Repetition: Impaired: sentence Naming: Confrontation: 76-100% and Divergent: 51-75% Pragmatics: Appears intact Comments:  Interfering components: N/A Effective technique: semantic cues and sentence completion Non-verbal means of communication: N/A  WRITTEN EXPRESSION: Dominant hand: right Written expression: Impaired: phrase  MOTOR SPEECH: Overall motor  speech: impaired Level of impairment:  Respiration: thoracic breathing Phonation: normal Resonance: WFL Articulation: Appears intact Intelligibility:  Intelligible Motor planning: Impaired: groping for words Motor speech errors: aware Interfering components:  N/A Effective technique: slow rate  TODAY'S TREATMENT:                                                                                                                                          10/24/22: Addressed language comprehension and spoken expression via reading and summarization task. Pt able to read aloud article comparing 10 best side by side ATVs with occasional challenges in decoding, average of 95% accuracy. Self-ID errors with self correction on majority of opportunities. Summarizes paragraph level demonstrating vague language which indicates reduced overall comprehension of material. Benefits from cues to re-read material, highlight important information PRN. Able to generate more salient description in response to questions to clarify material.   10/21/22: Reportedly reading novel with benefit of slowing down. Endorsed some difficulty with comprehension, resulting in stopping task. Pt endorsed some improvements in verbal expression since eval; however, unable to provide specific examples. Some intermittent reduced auditory comprehension exhibited in conversation requiring occasional SLP repetition and clarification. 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 Targeted functional naming of 15  mechanical terms. Pt wrote and verbally named related words with mod I. Pt able to describe targeted terms with rare min A. Further assessed reading comprehension via RCBA-2. Noted occasional phonemic errors during oral reading (ex: plam for palm, tables for tablets), with pt able to self-correct 90% of verbal errors at word and sentence levels with extra time. Demonstrated sufficient reading comprehension during structured task, with pt able to self-correct comprehension errors x3 at functional reading level with extra time. Updated HEP to include oral reading with strategies  provided in pt instructions.   10/15/22 (eval day): Introduced Careers adviser for word finding. Educated re: compensations for auditory comprehension. See patient instructions.   PATIENT EDUCATION: Education details: See Today's Treatment; compensations for aphasia; see Patient Instructions Person educated: Patient Education method: Explanation, Demonstration, Verbal cues, and Handouts Education comprehension: verbal cues required and needs further education   GOALS: Goals reviewed with patient? Yes  SHORT TERM GOALS: Target date: 11/12/22  Pt will name 15 items in personally relevant category with rare min A Baseline: 10; 18 Goal status: MET   2.  Pt will form complex sentences in structured task with rare min A Baseline: simple sentences only Goal status: IN PROGRESS  3.  Pt will employ verbal compensations for word finding in structured language tasks as needed with occasional min A Baseline: aborting utterance, no compensations, says "forget it" Goal status: IN PROGRESS  4.  Pt will comprehend 3 sentence paragraph with occasional min A Baseline: 1 sentence  Goal status: IN PROGRESS  5.  Pt will write 3 sentence text note correcting mistakes with occasional min A Baseline: 1  simple sentence, assistance to ID and correct errors Goal status: INITIAL  6.  Pt will carryover 3 strategies to improve auditory comprehension with occasional min A Baseline: no strategies; unaware he is not understanding Goal status: IN PROGRESS  LONG TERM GOALS: Target date: 12/10/22  Pt will carryover compensatory strategies for word finding in conversation 4/5 opportunities with rare min A Baseline: no strategies Goal status: IN PROGRESS  2.  Pt will carryover 3 strategies to compensate for impaired auditory comprehension at work with rare min A Baseline: no strategies Goal status: IN PROGRESS  3.  Pt will carryover compensatory strategy to comprehend measurements and cut wood/pipes  correct measurement over 1 week Baseline: not reading measurements; cutting wrong sizes at work Goal status: IN PROGRESS  4.  Pt will comprehend 5 sentence paragraph (work related) with occasional min A Baseline: 1 sentence, fiance is reading for him Goal status: IN PROGRESS  5.  Pt will improve score on Communicative Participation Item Bank by 4 points Baseline: 4/30 Goal status: IN PROGRESS   ASSESSMENT:  CLINICAL IMPRESSION: Patient is a 22 y.o. male who was seen today for aphasia. MRI pending 10/18/22. Kyriakos had episode of right UE weakness and difficulty speaking. He waited several days before going to the ED. Prior to incident he was working full time, completing IADLs and parenting young children with his fiance. He reports difficulty talking, reading, writing and understanding. He gets frustrated when he can't get words out and quits talking. He has had disagreements at home and work due to mis understandings from poor auditory comprehension. 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 He got a paragraph text from his boss and was unable to read it so he had his partner read it to him when he got home. Texting and writing are impaired, so Pritesh is has attempted speech to text to respond to texts with limited success. Today he was usually aware of aphasic errors in written sentences. Macedonio is at risk of having aphasia affect his success at work and his ability to maintain employment. I recommend skilled ST to maximize communication for success at work, success parenting, safety and independence. .   OBJECTIVE IMPAIRMENTS: include aphasia. These impairments are limiting patient from return to work, Clinical cytogeneticist finances, household responsibilities, ADLs/IADLs, and effectively communicating at home and in community. Factors affecting potential to achieve  goals and functional outcome are  n/a . Patient will benefit from skilled SLP services to address above impairments and improve overall function.  REHAB POTENTIAL: Good  PLAN:  SLP FREQUENCY: 2x/week  SLP DURATION: 8 weeks - will request 15 visits from medicaid  PLANNED INTERVENTIONS: Language facilitation, Environmental controls, Cueing hierachy, Cognitive reorganization, Internal/external aids, Functional tasks, Multimodal communication approach, SLP instruction and feedback, Compensatory strategies, and Patient/family education    Maia Breslow, CCC-SLP 10/24/2022, 9:35 AM

## 2022-10-27 ENCOUNTER — Ambulatory Visit: Payer: MEDICAID

## 2022-10-27 ENCOUNTER — Telehealth: Payer: Self-pay | Admitting: Neurology

## 2022-10-27 DIAGNOSIS — R4701 Aphasia: Secondary | ICD-10-CM | POA: Diagnosis not present

## 2022-10-27 NOTE — Telephone Encounter (Signed)
Lvm to rc 1st attempt

## 2022-10-27 NOTE — Patient Instructions (Signed)
Listening/comprehension strategies: Reduce distractions  Do one thing a time. Don't multitask  Focus on the conversation or task. Identify if you lose focus and ask for repetition  Re-read again if needed  Ask for clarification if needed

## 2022-10-27 NOTE — Telephone Encounter (Signed)
Phone room: Let pt know we will be in contact once Radiologist has read and submitted their findings to Dr. Teresa Coombs.  Thanks,  Production assistant, radio

## 2022-10-27 NOTE — Telephone Encounter (Signed)
Patient came in asking about his MRI results and if you could give him a call please on his cell# to discuss.

## 2022-10-27 NOTE — Telephone Encounter (Signed)
MRI has not been read.

## 2022-10-27 NOTE — Therapy (Signed)
OUTPATIENT SPEECH LANGUAGE PATHOLOGY APHASIA TREATMENT   Patient Name: [name] MRN: [id number] DOB:[birth year], 22 y.o., male Today's Date: 10/27/2022  PCP: Francee Piccolo NP REFERRING PROVIDER: Windell Norfolk MD  END OF SESSION:  End of Session - 10/27/22 0936     Visit Number 4    Number of Visits 17    Date for SLP Re-Evaluation 12/10/22    Authorization Type [employer] - request auth    SLP Start Time [phone]   pt arrived late   SLP Stop Time  1015    SLP Time Calculation (min) 39 min    Activity Tolerance Patient tolerated treatment well              Past Medical History:  Diagnosis Date   Bipolar 1 disorder (HCC)    Chlamydia infection 06/03/2016   4/26 pos test, 5/1 treated   Depression    History reviewed. No pertinent surgical history. Patient Active Problem List   Diagnosis Date Noted   Anxiety state 08/24/2019   History of gonorrhea 02/03/2019   Severe recurrent major depression with psychotic features (HCC) 09/25/2018   Delusional disorder (HCC) 09/25/2018   Syncope 01/07/2018   Chest wall pain 01/07/2018   Poor diet 01/07/2018   Allergic rhinitis 08/05/2017   Abnormal hearing screen 08/05/2017   History of hallucinations 07/01/2017   -chronic right-sided headaches- concerning for migraines 05/29/2016   Acne vulgaris 05/29/2016    ONSET DATE: 10/01/22   REFERRING DIAG: R47.89 (ICD-10-CM) - Word finding difficulty R47.1 (ICD-10-CM) - Dysarthria F80.81 (ICD-10-CM) - Stuttering  THERAPY DIAG:  Aphasia  Rationale for Evaluation and Treatment: Rehabilitation  SUBJECTIVE:   SUBJECTIVE STATEMENT: "Things have been going good. I've been feeling like I can get my words out"  Pt accompanied by: self  PERTINENT HISTORY: This is a 22 year old gentleman with past medical history of bipolar disorder who is presenting with complaint of right-sided numbness, word finding difficulty since October 01 2022.  Patient reports episodes started all of a  sudden, could not identify any precipitating factors.  He was having word finding difficulty, stuttering, and slurred speech.  On September 2 he started having right sided numbness involving the right arm and right leg.  He got concerned and presented to [facility] ED.  In the ED, had a normal head CT and was sent to [facility] for MRI.  While waiting for the MRI, he was told that it will take several hours and he did not have childcare, therefore he left AMA.  He reports his symptom are the same, have not gotten worse or better.  PAIN:  Are you having pain? No  FALLS: Has patient fallen in last 6 months?  No  LIVING ENVIRONMENT: Lives with: lives with their family Lives in: House/apartment  PLOF:  Level of assistance: Independent with ADLs, Independent with IADLs Employment: Full-time employment  PATIENT GOALS: "To get better"  OBJECTIVE:   AUDITORY COMPREHENSION: Overall auditory comprehension: Impaired: simple YES/NO questions: Impaired: moderately complex Following directions: Impaired: moderately complex Conversation: Simple Interfering components: n/a Effective technique: extra processing time, pausing, repetition/stressing words, slowed speech, stressing words, and visual/gestural cues  READING COMPREHENSION: Impaired: sentence and paragraph  EXPRESSION: verbal  VERBAL EXPRESSION: Level of generative/spontaneous verbalization: conversation Automatic speech: WNL  Repetition: Impaired: sentence Naming: Confrontation: 76-100% and Divergent: 51-75% Pragmatics: Appears intact Comments:  Interfering components: N/A Effective technique: semantic cues and sentence completion Non-verbal means of communication: N/A  WRITTEN EXPRESSION: Dominant hand: right Written expression: Impaired: phrase  MOTOR SPEECH: Overall motor speech: impaired Level of impairment:  Respiration: thoracic breathing Phonation: normal Resonance: WFL Articulation: Appears  intact Intelligibility: Intelligible Motor planning: Impaired: groping for words Motor speech errors: aware Interfering components:  N/A Effective technique: slow rate  TODAY'S TREATMENT:                                                                                                                                          10/27/22: Targeted attention in reading comprehension task. Pt demonstrated reduced attention and auditory comprehension when attempting to write notes about a story being read to him. Once SLP instructed pt to only listen to story to reduce distratction, auditory comprehension increased and pt summarized a story with 85% accuracy. Targeted language comprehension and spoken expression via reading task. Pt read two stories aloud with rare errors that were self-corrected 95% of the time. Demonstrated one instance of increased frustration due to phonemic error, with pt stating "I know the word I just can't say it." Benefited from cues to slow down and break the word up. Pt answered comprehension questions with 85% accuracy given occasional min A. SLP educated on attention strategies, such as reducing distractions, limiting multitasking, asking for clarification if focus is lost in conversation, and re-reading to increase comprehension.   10/24/22: Addressed language comprehension and spoken expression via reading and summarization task. Pt able to read aloud article comparing 10 best side by side ATVs with occasional challenges in decoding, average of 95% accuracy. Self-ID errors with self correction on majority of opportunities. Summarizes paragraph level demonstrating vague language which indicates reduced overall comprehension of material. Benefits from cues to re-read material, highlight important information PRN. Able to generate more salient description in response to questions to clarify material.   10/21/22: Reportedly reading novel with benefit of slowing down. Endorsed some  difficulty with comprehension, resulting in stopping task. Pt endorsed some improvements in verbal expression since eval; however, unable to provide specific examples. Some intermittent reduced auditory comprehension exhibited in conversation requiring occasional SLP repetition and clarification. 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 Targeted functional naming of 15  mechanical terms. Pt wrote and verbally named related words with mod I. Pt able to describe targeted terms with rare min A. Further assessed reading comprehension via RCBA-2. Noted occasional phonemic errors during oral reading (ex: plam for palm, tables for tablets), with pt able to self-correct 90% of verbal errors at word and sentence levels with extra time. Demonstrated sufficient reading comprehension during structured task, with pt able to self-correct comprehension errors x3 at functional reading level with extra time. Updated HEP to include oral reading with strategies provided in pt instructions.   10/15/22 (eval day): Introduced Careers adviser for word finding. Educated re: compensations for auditory comprehension. See patient instructions.   PATIENT EDUCATION: Education details: See Today's Treatment; compensations for aphasia; see Patient Instructions Person educated: Patient  Education method: Explanation, Demonstration, Verbal cues, and Handouts Education comprehension: verbal cues required and needs further education   GOALS: Goals reviewed with patient? Yes  SHORT TERM GOALS: Target date: 11/12/22  Pt will name 15 items in personally relevant category with rare min A Baseline: 10; 18 Goal status: MET   2.  Pt will form complex sentences in structured task with rare min A Baseline: simple sentences only Goal status: IN PROGRESS  3.  Pt will employ verbal compensations for word finding in structured language tasks as needed with occasional min A Baseline: aborting utterance, no  compensations, says "forget it" Goal status: IN PROGRESS  4.  Pt will comprehend 3 sentence paragraph with occasional min A Baseline: 1 sentence; 7 sentences  Goal status: MET  5.  Pt will write 3 sentence text note correcting mistakes with occasional min A Baseline: 1 simple sentence, assistance to ID and correct errors Goal status: INITIAL  6.  Pt will carryover 3 strategies to improve auditory comprehension with occasional min A Baseline: no strategies; unaware he is not understanding Goal status: IN PROGRESS  LONG TERM GOALS: Target date: 12/10/22  Pt will carryover compensatory strategies for word finding in conversation 4/5 opportunities with rare min A Baseline: no strategies Goal status: IN PROGRESS  2.  Pt will carryover 3 strategies to compensate for impaired auditory comprehension at work with rare min A Baseline: no strategies Goal status: IN PROGRESS  3.  Pt will carryover compensatory strategy to comprehend measurements and cut wood/pipes correct measurement over 1 week Baseline: not reading measurements; cutting wrong sizes at work Goal status: IN PROGRESS  4.  Pt will comprehend 5 sentence paragraph (work related) with occasional min A Baseline: 1 sentence, fiance is reading for him Goal status: IN PROGRESS  5.  Pt will improve score on Communicative Participation Item Bank by 4 points Baseline: 4/30 Goal status: IN PROGRESS   ASSESSMENT:  CLINICAL IMPRESSION: Patient is a 22 y.o. male who was seen today for aphasia. MRI pending 10/18/22. Today pt reporting improvements in verbal expression and functional reading comprehension. Endorsed attention impacting auditory comprehension in conversation. Provided education and instruction of auditory processing strategies to aid attention and subsequently comprehension. Verbal expression without overt paraphasias but notably vague, requiring SLP clarification. Cartrell is at risk of having aphasia affect his success at  work and his ability to maintain employment. I recommend skilled ST to maximize communication for success at work, success parenting, safety and independence.   OBJECTIVE IMPAIRMENTS: include aphasia. These impairments are limiting patient from return to work, Clinical cytogeneticist finances, household responsibilities, ADLs/IADLs, and effectively communicating at home and in community. Factors affecting potential to achieve goals and functional outcome are  n/a . Patient will benefit from skilled SLP services to address above impairments and improve overall function.  REHAB POTENTIAL: Good  PLAN:  SLP FREQUENCY: 2x/week  SLP DURATION: 8 weeks - will request 15 visits from medicaid  PLANNED INTERVENTIONS: Language facilitation, Environmental controls, Cueing hierachy, Cognitive reorganization, Internal/external aids, Functional tasks, Multimodal communication approach, SLP instruction and feedback, Compensatory strategies, and Patient/family education    Delany Loonam, Student-SLP 10/27/2022, 11:02 AM

## 2022-10-29 ENCOUNTER — Telehealth: Payer: Self-pay | Admitting: *Deleted

## 2022-10-29 NOTE — Telephone Encounter (Signed)
Patient called back regarding his MRI results, would like a call back at preferred number

## 2022-10-29 NOTE — Telephone Encounter (Signed)
Call to patient, no answer. Left message to all office back

## 2022-10-29 NOTE — Telephone Encounter (Signed)
Please contact patient and inform him that both his MRI Cervical spine and Brain are normal. No abnormalities seen.

## 2022-10-29 NOTE — Telephone Encounter (Signed)
Pt was called and I informed him of: both his MRI Cervical spine and Brain are normal. No abnormalities seen.   Pt voiced gratitude and understanding

## 2022-10-29 NOTE — Telephone Encounter (Signed)
Dr. Teresa Coombs,  Pt called in again requesting update. I think [facility] is 15 days or so behind on reading we escalated this one to hopefully get ready sooner.  Thanks,  Production assistant, radio

## 2022-10-29 NOTE — Telephone Encounter (Signed)
Please call pt about his results [phone]

## 2022-10-30 ENCOUNTER — Ambulatory Visit: Payer: MEDICAID | Admitting: Speech Pathology

## 2022-11-04 ENCOUNTER — Telehealth: Payer: Self-pay | Admitting: Speech Pathology

## 2022-11-04 ENCOUNTER — Ambulatory Visit: Payer: MEDICAID | Admitting: Speech Pathology

## 2022-11-04 NOTE — Telephone Encounter (Signed)
[facility] [address] [place], [place], [address] Phone: [phone]   Fax:  [phone]  Encounter Date: 11/04/2022  SLP contacted pt via telephone number listed in medical chart, no response. VM left detailing clinic's no show policy and advised that with third no-show, remainder of pt's scheduled ST visits will be cancelled and pt will be d/c from service, requiring new referral if ST is still needed.   Maia Breslow, CCC-SLP 11/04/2022, 10:05 AM  [facility] [address] [place], [place], [address] Phone: [phone]   Fax:  [phone]

## 2022-11-06 ENCOUNTER — Ambulatory Visit: Payer: MEDICAID | Admitting: Speech Pathology

## 2022-11-08 ENCOUNTER — Other Ambulatory Visit: Payer: Self-pay

## 2022-11-08 ENCOUNTER — Emergency Department (HOSPITAL_BASED_OUTPATIENT_CLINIC_OR_DEPARTMENT_OTHER)
Admission: EM | Admit: 2022-11-08 | Discharge: 2022-11-08 | Disposition: A | Discharge status: Home / Self Care | Payer: Worker's Compensation | Attending: Emergency Medicine | Admitting: Emergency Medicine

## 2022-11-08 ENCOUNTER — Encounter (HOSPITAL_BASED_OUTPATIENT_CLINIC_OR_DEPARTMENT_OTHER): Payer: Self-pay

## 2022-11-08 ENCOUNTER — Emergency Department (HOSPITAL_BASED_OUTPATIENT_CLINIC_OR_DEPARTMENT_OTHER): Payer: Worker's Compensation

## 2022-11-08 DIAGNOSIS — M79672 Pain in left foot: Secondary | ICD-10-CM | POA: Diagnosis present

## 2022-11-08 DIAGNOSIS — M25572 Pain in left ankle and joints of left foot: Secondary | ICD-10-CM | POA: Insufficient documentation

## 2022-11-08 DIAGNOSIS — S8992XA Unspecified injury of left lower leg, initial encounter: Secondary | ICD-10-CM

## 2022-11-08 DIAGNOSIS — R200 Anesthesia of skin: Secondary | ICD-10-CM | POA: Insufficient documentation

## 2022-11-08 MED ORDER — OXYCODONE HCL 5 MG PO TABS
5.0000 mg | ORAL_TABLET | Freq: Three times a day (TID) | ORAL | 0 refills | Status: DC | PRN
Start: 2022-11-08 — End: 2023-09-13

## 2022-11-08 MED ORDER — IBUPROFEN 600 MG PO TABS
600.0000 mg | ORAL_TABLET | Freq: Four times a day (QID) | ORAL | 0 refills | Status: DC | PRN
Start: 2022-11-08 — End: 2023-09-13

## 2022-11-08 MED ORDER — IBUPROFEN 800 MG PO TABS
800.0000 mg | ORAL_TABLET | Freq: Once | ORAL | Status: AC
  Administered 2022-11-08: 800 mg via ORAL
  Filled 2022-11-08: qty 1

## 2022-11-08 MED ORDER — ACETAMINOPHEN 325 MG PO TABS: 650.0000 mg | ORAL_TABLET | Freq: Four times a day (QID) | ORAL | 0 refills | Status: DC | PRN

## 2022-11-08 NOTE — ED Provider Notes (Signed)
[name] Provider Note   CSN: [id number] Arrival date & time: 11/08/22  1637     History  Chief Complaint  Patient presents with   Ankle Pain    [name] is a 22 y.o. male who presents with left ankle and foot pain that started just prior to arrival.  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 He then had immediate pain.  Has not been able to walk on the foot.  Does not know if he twisted his ankle.  Reports numbness in his toes.   Ankle Pain      Home Medications Prior to Admission medications   Medication Sig Start Date End Date Taking? Authorizing Provider  amoxicillin-clavulanate (AUGMENTIN) 875-125 MG tablet Take 1 tablet by mouth every 12 (twelve) hours. Patient not taking: Reported on 10/15/2022 08/19/22   Rodriguez-Southworth, Nettie Elm, PA-C  buPROPion (WELLBUTRIN XL) 150 MG 24 hr tablet Take 1 tablet (150 mg total) by mouth daily. 08/19/22   Rodriguez-Southworth, Nettie Elm, PA-C  neomycin-polymyxin-hydrocortisone (CORTISPORIN) 3.5-10000-1 OTIC suspension Place 4 drops into the left ear 3 (three) times daily. For 7 days Patient not taking: Reported on 10/15/2022 08/19/22   Rodriguez-Southworth, Nettie Elm, PA-C  promethazine (PHENERGAN) 25 MG tablet Take 1 tablet (25 mg total) by mouth every 6 (six) hours as needed for nausea or vomiting. Patient not taking: Reported on 05/26/2019 04/01/19 05/24/20  Gilda Crease, MD      Allergies    Patient has no known allergies.    Review of Systems   Review of Systems  Musculoskeletal:        Left foot and ankle pain    Physical Exam Updated Vital Signs BP 116/68 (BP Location: Left Arm)   Pulse 93   Temp 98.8 F (37.1 C) (Oral)   Resp 18   Ht 5\' 9"  (1.753 m)   Wt 81.6 kg   SpO2 98%   BMI 26.58 kg/m  Physical Exam Vitals and nursing note reviewed.  Constitutional:      Appearance: Normal appearance.  HENT:     Head:  Atraumatic.  Pulmonary:     Effort: Pulmonary effort is normal.  Musculoskeletal:     Comments: Left ankle and foot without any obvious deformity.  Moderate edema of the left ankle extending into the left midfoot and toes.  No erythema, ecchymoses. No abrasions, lacerations, or bite marks. Patient diffusely tender to palpation in the left ankle and left foot across the 1st through 5th metatarsals Patient unable to perform ankle plantarflexion, dorsiflexion, inversion, eversion  Thompsons test bilaterally produces plantarflexion Dorsalis pedis pulse 2+ bilaterally  Unable to ambulate on left foot  Skin:    Capillary Refill: Cap refill of 1-5th digits of the left foot 2+ Neurological:     General: No focal deficit present.     Mental Status: He is alert.  Psychiatric:        Mood and Affect: Mood normal.        Behavior: Behavior normal.     ED Results / Procedures / Treatments   Labs (all labs ordered are listed, but only abnormal results are displayed) Labs Reviewed - No data to display  EKG None  Radiology DG Ankle Complete Left  Result Date: 11/08/2022 CLINICAL DATA:  Ankle injury EXAM: LEFT ANKLE COMPLETE - 3+ VIEW COMPARISON:  None Available. FINDINGS: There is no evidence of fracture, dislocation, or joint effusion. There is  no evidence of arthropathy or other focal bone abnormality. Soft tissues are unremarkable. IMPRESSION: Negative. Electronically Signed   By: Jasmine Pang M.D.   On: 11/08/2022 18:32    Procedures Procedures    Medications Ordered in ED Medications  ibuprofen (ADVIL) tablet 800 mg (800 mg Oral Given 11/08/22 1806)    ED Course/ Medical Decision Making/ A&P Clinical Course as of 11/08/22 1902  Sat Nov 08, 2022  1845 This is a 22 year old male presenting to ED with complaint of left ankle and foot pain.  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  He  reports he has lost feeling in the distal end of his left foot involving all the toes, and also has severe pain in his midfoot and his left ankle.  He is not able to bear weight.  He denies prior injuries, or any other reported injuries.  On exam there is no evidence swelling of the lower extremity.  The patient has diffuse tenderness involving the medial and posterior malleoli of the left foot, the anterior talofibular ligament, as well as the midfoot, without visible ecchymoses or deformity.  He is not able to provide me any effort with ankle flexion or extension, but does not have a foot drop on exam.  He is also not able to wiggle his toes, but has brisk capillary refills.  X-ray imaging showed no acute abnormalities.  Patient is pending CT imaging to evaluate for occult fracture or possible midfoot fracture that is not visualized well on xray.  The distribution of his paresthesias in his weakness does not correlate with any clear nerve injury - would need to be more proximal tibial nerve, but does not explain mid-foot abrupt sensation discrepancies.  Pending CT imaging [MT]    Clinical Course User Index [MT] Trifan, Kermit Balo, MD                                 Medical Decision Making Amount and/or Complexity of Data Reviewed Radiology: ordered.  Risk Prescription drug management.   22 y.o. male presents to the ED for concern of diffuse left ankle and left foot pain, numbness in left toes  Differential diagnosis includes but is not limited to fracture, dislocation, ankle sprain, soft tissue injury, compartment syndrome  ED Course:  Patient presents with concern for diffuse left ankle and foot pain that occurred prior to arrival.  Unable to provide a good history as to how this occurred.  States he was walking around a car and stepped on something and heard a crack in his foot.  Had immediate pain and is unable to walk on the foot.  There is mild diffuse edema of the left ankle and foot  without any obvious deformity, erythema, ecchymoses.  He is diffusely tender to palpation of the left ankle into the left midfoot.  Then reports numbness in the 1st through 5th digits of the left foot.  Unable to perform range of motion of the 1st through 5th digits of the left foot.  Unable to perform dorsiflexion, plantarflexion, inversion, eversion of the left ankle.  Has cap refill under 2 seconds in the left foot, dorsalis pedis pulse 2+.  No concern for compartment syndrome at this time.  X-ray without any acute abnormalities.  This case was discussed with Dr. Renaye Rakers who also evaluated  patient.  Plan to proceed with CT imaging of the foot for further evaluation. Patient given 800mg  ibuprofen for pain   Impression: Left ankle and foot pain  Disposition:  Care of this patient signed out to oncoming ED provider Dr. Renaye Rakers. Disposition and treatment plan pending imaging results and clinical judgment of oncoming ED team.     Imaging Studies ordered: I ordered imaging studies including x-ray left ankle I independently visualized the imaging with scope of interpretation limited to determining acute life threatening conditions related to emergency care. Imaging showed no obvious fracture dislocation, joint spaces well aligned I agree with the radiologist interpretation              Final Clinical Impression(s) / ED Diagnoses Final diagnoses:  Left foot pain    Rx / DC Orders ED Discharge Orders     None         Arabella Merles, Cordelia Poche 11/08/22 1902    Terald Sleeper, MD 11/08/22 2330

## 2022-11-08 NOTE — ED Triage Notes (Signed)
Pt presents with L ankle injury after stepping on it wrong while at work.

## 2022-11-08 NOTE — Discharge Instructions (Addendum)
Your CT scan showed that you may have a tear or an injury to the tendons or muscles of your lower leg, and possibly an associated small fracture or broken bone with that.  I recommend that you wear the splint at all time and use your crutches whenever walking.  Do not put weight on your left foot until you are cleared to do so by an orthopedic doctor.  You will need to call the number above on Monday to schedule follow-up appointment with an orthopedic surgeon.

## 2022-11-13 ENCOUNTER — Encounter: Payer: MEDICAID | Admitting: Speech Pathology

## 2022-11-20 ENCOUNTER — Encounter: Payer: MEDICAID | Admitting: Speech Pathology

## 2022-11-25 ENCOUNTER — Encounter: Payer: MEDICAID | Admitting: Speech Pathology

## 2022-11-27 ENCOUNTER — Encounter: Payer: MEDICAID | Admitting: Speech Pathology

## 2022-12-04 ENCOUNTER — Encounter: Payer: MEDICAID | Admitting: Speech Pathology

## 2023-03-11 NOTE — Therapy (Signed)
 [facility] [address] [place], [place], [address] Phone: [phone]   Fax:  [phone]  Patient Details  Name: [name] MRN: [id number] Date of Birth: [birth year] Referring Provider:  No ref. provider found  Encounter Date: 03/11/2023  SPEECH THERAPY DISCHARGE SUMMARY  Visits from Start of Care: 4  Current functional level related to goals / functional outcomes: Unknown as pt did not return after last ST visit   Remaining deficits: Unknown   Education / Equipment: Communication strategies   Patient agrees to discharge. Patient goals were partially met. Patient is being discharged due to not returning since the last visit.SABRA Comer LILLETTE Vicci, CCC-SLP 03/11/2023, 10:01 AM  [facility] [address] [place], [place], [address] Phone: [phone]   Fax:  [phone]

## 2023-08-23 ENCOUNTER — Encounter (HOSPITAL_COMMUNITY): Payer: Self-pay

## 2023-08-23 ENCOUNTER — Other Ambulatory Visit: Payer: Self-pay

## 2023-08-23 ENCOUNTER — Emergency Department (HOSPITAL_COMMUNITY)
Admission: EM | Admit: 2023-08-23 | Discharge: 2023-08-23 | Disposition: A | Discharge status: Home / Self Care | Payer: MEDICAID | Attending: Emergency Medicine | Admitting: Emergency Medicine

## 2023-08-23 ENCOUNTER — Emergency Department (HOSPITAL_COMMUNITY): Payer: MEDICAID

## 2023-08-23 DIAGNOSIS — M79641 Pain in right hand: Secondary | ICD-10-CM | POA: Insufficient documentation

## 2023-08-23 DIAGNOSIS — W228XXA Striking against or struck by other objects, initial encounter: Secondary | ICD-10-CM | POA: Insufficient documentation

## 2023-08-23 MED ORDER — ACETAMINOPHEN 500 MG PO TABS
1000.0000 mg | ORAL_TABLET | Freq: Once | ORAL | Status: AC
  Administered 2023-08-23: 1000 mg via ORAL
  Filled 2023-08-23: qty 2

## 2023-08-23 MED ORDER — IBUPROFEN 200 MG PO TABS
600.0000 mg | ORAL_TABLET | Freq: Once | ORAL | Status: AC
  Administered 2023-08-23: 600 mg via ORAL
  Filled 2023-08-23: qty 3

## 2023-08-23 NOTE — Discharge Instructions (Signed)
 It was a pleasure taking part in your care.  As discussed, the x-ray today is negative for any acute fracture.  Please take ibuprofen  and Tylenol  for pain at home.  Please elevate your hand at night, apply ice.  You may wear a supportive wrist brace that we discussed for support.  Please follow-up with your PCP.  Return to the ED with any new symptoms.

## 2023-08-23 NOTE — ED Provider Notes (Signed)
 [place] [facility] AT [facility] Provider Note   CSN: [id number] Arrival date & time: 08/23/23  2005     Patient presents with: No chief complaint on file.   [name] is a 23 y.o. male with history of bipolar disorder.  Patient presents to ED for evaluation of right hand injury.  States that earlier today he was attempting to pull down a tree when he tied a rope around his hand.  States that this rope tightened on him all of a sudden causing constriction to his right hand.  He reports that he did not feel a crack, snap or pop.  He denies any pain in his wrist.  States that his hand was constricted for about 3 seconds and then released.  Denies history of surgical intervention of right hand or right wrist.  Reports history of boxer fracture right hand.  Denies medications prior arrival.  Here for evaluation of right hand pain.   HPI     Prior to Admission medications   Medication Sig Start Date End Date Taking? Authorizing Provider  acetaminophen  (TYLENOL ) 325 MG tablet Take 2 tablets (650 mg total) by mouth every 6 (six) hours as needed for up to 30 doses for moderate pain or mild pain. 11/08/22   Cottie Donnice PARAS, MD  amoxicillin -clavulanate (AUGMENTIN ) 875-125 MG tablet Take 1 tablet by mouth every 12 (twelve) hours. Patient not taking: Reported on 10/15/2022 08/19/22   Rodriguez-Southworth, Sylvia, PA-C  buPROPion  (WELLBUTRIN  XL) 150 MG 24 hr tablet Take 1 tablet (150 mg total) by mouth daily. 08/19/22   Rodriguez-Southworth, Sylvia, PA-C  ibuprofen  (ADVIL ) 600 MG tablet Take 1 tablet (600 mg total) by mouth every 6 (six) hours as needed for up to 30 doses for mild pain or moderate pain. 11/08/22   Cottie Donnice PARAS, MD  neomycin -polymyxin-hydrocortisone (CORTISPORIN) 3.5-10000-1 OTIC suspension Place 4 drops into the left ear 3 (three) times daily. For 7 days Patient not taking: Reported on 10/15/2022 08/19/22   Rodriguez-Southworth, Kyra, PA-C  oxyCODONE   (ROXICODONE ) 5 MG immediate release tablet Take 1 tablet (5 mg total) by mouth every 8 (eight) hours as needed for up to 8 doses for severe pain. 11/08/22   Cottie Donnice PARAS, MD  promethazine  (PHENERGAN ) 25 MG tablet Take 1 tablet (25 mg total) by mouth every 6 (six) hours as needed for nausea or vomiting. Patient not taking: Reported on 05/26/2019 04/01/19 05/24/20  Haze Lonni PARAS, MD    Allergies: Patient has no known allergies.    Review of Systems  Musculoskeletal:  Positive for myalgias.  All other systems reviewed and are negative.   Updated Vital Signs BP 131/87 (BP Location: Right Arm)   Pulse 83   Temp 98.6 F (37 C) (Oral)   Resp 18   Ht 5' 9 (1.753 m)   Wt 83.9 kg   SpO2 100%   BMI 27.32 kg/m   Physical Exam Vitals and nursing note reviewed.  Constitutional:      General: He is not in acute distress.    Appearance: He is well-developed.  HENT:     Head: Normocephalic and atraumatic.  Eyes:     Conjunctiva/sclera: Conjunctivae normal.  Cardiovascular:     Rate and Rhythm: Normal rate and regular rhythm.     Heart sounds: No murmur heard. Pulmonary:     Effort: Pulmonary effort is normal. No respiratory distress.     Breath sounds: Normal breath sounds.  Abdominal:     Palpations:  Abdomen is soft.     Tenderness: There is no abdominal tenderness.  Musculoskeletal:        General: No swelling.     Right hand: Normal.     Left hand: Normal.     Cervical back: Neck supple.     Comments: No deformity, swelling, injury noted patient right hand.  Brisk cap refill.  2+ radial pulse.  Neurovascularly intact.  Slightly reduced range of motion of digits due to pain.  No snuffbox tenderness.  Skin:    General: Skin is warm and dry.     Capillary Refill: Capillary refill takes less than 2 seconds.  Neurological:     Mental Status: He is alert.  Psychiatric:        Mood and Affect: Mood normal.     (all labs ordered are listed, but only abnormal results are  displayed) Labs Reviewed - No data to display  EKG: None  Radiology: DG Hand Complete Right Result Date: 08/23/2023 EXAM: 3 or more VIEW(S) XRAY OF THE RIGHT HAND 08/23/2023 08:42:23 PM COMPARISON: None available. CLINICAL HISTORY: Hand injury. Pt had rope wrapped around hand today and it got caught. Generalized hand pain. FINDINGS: BONES AND JOINTS: No acute fracture. No focal osseous lesion. No joint dislocation. SOFT TISSUES: The soft tissues are unremarkable. IMPRESSION: 1. No acute osseous abnormality. Electronically signed by: Pinkie Pebbles MD 08/23/2023 08:53 PM EDT RP Workstation: [id number]    Procedures   Medications Ordered in the ED  ibuprofen  (ADVIL ) tablet 600 mg (has no administration in time range)  acetaminophen  (TYLENOL ) tablet 1,000 mg (has no administration in time range)    Medical Decision Making Amount and/or Complexity of Data Reviewed Radiology: ordered.  Risk OTC drugs.   2 year West presenting for right hand pain.  Please see HPI for further details.  48 year West here with right hand pain after having a rope tied around his hand, weight being applied to the rope and the rope tightening around his hand.  He denies any wrist pain, elbow pain on the right upper extremity.  He denies any numbness or tingling.  X-ray imaging was collected in triage which shows no acute osseous abnormality.  I agree with radiologist interpretation.  Patient was counseled to elevate hand at night, take ibuprofen  and Tylenol , ice.  He was encouraged to wear supportive wrist brace if he feels the need to do so, he reports that he has 1 at home.  He was given return precautions and he voiced understanding.  Advised to follow-up with PCP.  Stable to discharge.    Final diagnoses:  Right hand pain    ED Discharge Orders     None          [name] 08/23/23 2236    Francesca Elsie CROME, MD 08/23/23 2253

## 2023-08-23 NOTE — ED Triage Notes (Signed)
 Pt reports:  Hand pain (Right) Accident  Playing with a rope wrapped around a tree The rope accidentally tightened around patient hand Pain radiates to wrist

## 2023-09-13 ENCOUNTER — Ambulatory Visit
Admission: EM | Admit: 2023-09-13 | Discharge: 2023-09-13 | Disposition: A | Discharge status: Home / Self Care | Attending: Emergency Medicine | Admitting: Emergency Medicine

## 2023-09-13 ENCOUNTER — Other Ambulatory Visit: Payer: Self-pay

## 2023-09-13 DIAGNOSIS — B349 Viral infection, unspecified: Secondary | ICD-10-CM

## 2023-09-13 LAB — POC COVID19/FLU A&B COMBO
Covid Antigen, POC: NEGATIVE
Influenza A Antigen, POC: NEGATIVE
Influenza B Antigen, POC: NEGATIVE

## 2023-09-13 MED ORDER — ACETAMINOPHEN 325 MG PO TABS
975.0000 mg | ORAL_TABLET | Freq: Once | ORAL | Status: AC
  Administered 2023-09-13: 975 mg via ORAL

## 2023-09-13 MED ORDER — IBUPROFEN 800 MG PO TABS: 800.0000 mg | ORAL_TABLET | Freq: Three times a day (TID) | ORAL | 0 refills | Status: DC

## 2023-09-13 NOTE — Discharge Instructions (Addendum)
 Alternate ibuprofen  and tylenol  every 4 hours for headache, fever, etc Drink LOTS of water to stay hydrated Try to get lots of rest Please go to the emergency department if symptoms worsen, especially if headache is severe

## 2023-09-13 NOTE — ED Triage Notes (Signed)
 Pt c/o Hax3-4d. Pt c/o bodyache, chills and eyes painful bilatx2d.

## 2023-09-13 NOTE — ED Provider Notes (Signed)
 [facility]    CSN: [id number] Arrival date & time: 09/13/23  1427      History   Chief Complaint Chief Complaint  Patient presents with   Headache   Chills    HPI [name] is a 23 y.o. male.  3-day history of headache, body aches, chills, runny nose Head pain rated 8/10 No known fever. No cough. No abdominal pain, NVD, rash.  Has tried tylenol  yesterday. No meds today.  Possible sick contacts? 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 No pertinent surgical history.     Home Medications    Prior to Admission medications   Medication Sig Start Date End Date Taking? Authorizing Provider  ibuprofen  (ADVIL ) 800 MG tablet Take 1 tablet (800 mg total) by mouth 3 (three) times daily. 09/13/23  Yes Graesyn Schreifels, Asberry, PA-C  acetaminophen  (TYLENOL ) 325 MG tablet Take 2 tablets (650 mg total) by mouth every 6 (six) hours as needed for up to 30 doses for moderate pain or mild pain. 11/08/22   Cottie Donnice PARAS, MD  promethazine  (PHENERGAN ) 25 MG tablet Take 1 tablet (25 mg total) by mouth every 6 (six) hours as needed for nausea or vomiting. Patient not taking: Reported on 05/26/2019 04/01/19 05/24/20  Haze Lonni PARAS, MD    Family  History Family History  Problem Relation Age of Onset   Healthy Mother    Heart disease Neg Hx     Social History Social History   Tobacco Use   Smoking status: Never   Smokeless tobacco: Never   Tobacco comments:    mom smokes outside   Vaping Use   Vaping status: Some Days   Substances: Nicotine  Substance Use Topics   Alcohol use: Yes    Comment: Socially   Drug use: Not Currently    Types: Marijuana     Allergies   Patient has no known allergies.   Review of Systems Review of Systems As per HPI  Physical Exam Triage Vital Signs ED Triage Vitals  Encounter Vitals Group     BP 09/13/23 1447 124/74     Girls Systolic BP Percentile --      Girls Diastolic BP Percentile --      Boys Systolic BP Percentile --      Boys Diastolic BP Percentile --      Pulse Rate 09/13/23 1447 75     Resp 09/13/23 1447 16     Temp 09/13/23 1447 98.9 F (37.2 C)     Temp Source 09/13/23 1447 Oral     SpO2 09/13/23 1447 95 %     Weight --      Height --      Head Circumference --  Peak Flow --      Pain Score 09/13/23 1446 8     Pain Loc --      Pain Education --      Exclude from Growth Chart --    No data found.  Updated Vital Signs BP 124/74   Pulse 75   Temp 98.9 F (37.2 C) (Oral)   Resp 16   SpO2 95%    Physical Exam Vitals and nursing note reviewed.  Constitutional:      Appearance: He is not ill-appearing.  HENT:     Right Ear: Tympanic membrane and ear canal normal.     Left Ear: Tympanic membrane and ear canal normal.     Nose: No congestion or rhinorrhea.     Mouth/Throat:     Mouth: Mucous membranes are moist.     Pharynx: Oropharynx is clear. No oropharyngeal exudate or posterior oropharyngeal erythema.  Eyes:     General: Vision grossly intact. Gaze aligned appropriately.     Extraocular Movements: Extraocular movements intact.     Right eye: Normal extraocular motion and no nystagmus.     Left eye: Normal extraocular motion and no  nystagmus.     Conjunctiva/sclera: Conjunctivae normal.     Pupils: Pupils are equal, round, and reactive to light.  Cardiovascular:     Rate and Rhythm: Normal rate and regular rhythm.     Pulses: Normal pulses.     Heart sounds: Normal heart sounds.  Pulmonary:     Effort: Pulmonary effort is normal.     Breath sounds: Normal breath sounds.  Abdominal:     Palpations: Abdomen is soft.     Tenderness: There is no abdominal tenderness.  Musculoskeletal:     Cervical back: Normal range of motion. No rigidity.  Lymphadenopathy:     Cervical: No cervical adenopathy.  Skin:    General: Skin is warm and dry.  Neurological:     General: No focal deficit present.     Mental Status: He is alert and oriented to person, place, and time.     Cranial Nerves: Cranial nerves 2-12 are intact.     Sensory: Sensation is intact.     Motor: Motor function is intact.     Coordination: Coordination is intact.     Gait: Gait is intact.      UC Treatments / Results  Labs (all labs ordered are listed, but only abnormal results are displayed) Labs Reviewed  POC COVID19/FLU A&B COMBO    EKG   Radiology No results found.  Procedures Procedures (including critical care time)  Medications Ordered in UC Medications  acetaminophen  (TYLENOL ) tablet 975 mg (975 mg Oral Given 09/13/23 1602)    Initial Impression / Assessment and Plan / UC Course  I have reviewed the triage vital signs and the nursing notes.  Pertinent labs & imaging results that were available during my care of the patient were reviewed by me and considered in my medical decision making (see chart for details).  Afebrile in clinic, overall well appearing  Neurologically intact Rapid flu and COVID are negative Discussed likely viral etiology, supportive care and OTC options Offered IM toradol  in clinic, patient does not want an injection and declines. Tylenol  dose given oral. Advised alternating with ibuprofen . Return and ED  precautions are discussed. Patient agrees to plan, all questions answered  Final Clinical Impressions(s) / UC Diagnoses   Final diagnoses:  Viral illness     Discharge Instructions  Alternate ibuprofen  and tylenol  every 4 hours for headache, fever, etc Drink LOTS of water to stay hydrated Try to get lots of rest Please go to the emergency department if symptoms worsen, especially if headache is severe     ED Prescriptions     Medication Sig Dispense Auth. Provider   ibuprofen  (ADVIL ) 800 MG tablet Take 1 tablet (800 mg total) by mouth 3 (three) times daily. 21 tablet Brendalyn Vallely, Asberry, PA-C      PDMP not reviewed this encounter.   Jeryl Asberry, [place] 09/13/23 8395

## 2024-02-16 ENCOUNTER — Ambulatory Visit
Admission: EM | Admit: 2024-02-16 | Discharge: 2024-02-16 | Disposition: A | Discharge status: Home / Self Care | Attending: Family Medicine | Admitting: Family Medicine

## 2024-02-16 DIAGNOSIS — J988 Other specified respiratory disorders: Secondary | ICD-10-CM | POA: Diagnosis not present

## 2024-02-16 DIAGNOSIS — B9789 Other viral agents as the cause of diseases classified elsewhere: Secondary | ICD-10-CM | POA: Diagnosis not present

## 2024-02-16 MED ORDER — IBUPROFEN 800 MG PO TABS: 800.0000 mg | ORAL_TABLET | Freq: Three times a day (TID) | ORAL | 0 refills | Status: AC

## 2024-02-16 MED ORDER — PROMETHAZINE-DM 6.25-15 MG/5ML PO SYRP: 5.0000 mL | ORAL_SOLUTION | Freq: Three times a day (TID) | ORAL | 0 refills | Status: AC | PRN

## 2024-02-16 MED ORDER — PSEUDOEPHEDRINE HCL 60 MG PO TABS: 60.0000 mg | ORAL_TABLET | Freq: Three times a day (TID) | ORAL | 0 refills | Status: AC | PRN

## 2024-02-16 MED ORDER — CETIRIZINE HCL 10 MG PO TABS: 10.0000 mg | ORAL_TABLET | Freq: Every day | ORAL | 0 refills | Status: AC

## 2024-02-16 NOTE — ED Provider Notes (Signed)
 " [facility]  Note:  This document was prepared using Conservation officer, historic buildings and may include unintentional dictation errors.  MRN: [id number] DOB: [birth year]  Subjective:   [name] is a 24 y.o. male presenting for 4-5 day history of dizziness, sinus congestion, drainage, body aches, subjective fever, productive cough. No throat pain, ear pain, chest pain, shob, wheezing.  No asthma. Vapes daily.   Current Outpatient Medications  Medication Instructions   acetaminophen  (TYLENOL ) 650 mg, Oral, Every 6 hours PRN   ibuprofen  (ADVIL ) 800 mg, Oral, 3 times daily    Allergies[1]  Past Medical History:  Diagnosis Date   Bipolar 1 disorder (HCC)    Chlamydia infection 06/03/2016   4/26 pos test, 5/1 treated   Depression      History reviewed. No pertinent surgical history.  Family History  Problem Relation Age of Onset   Healthy Mother    Heart disease Neg Hx     Social History   Occupational History   Not on file  Tobacco Use   Smoking status: Never   Smokeless tobacco: Never   Tobacco comments:    mom smokes outside   Vaping Use   Vaping status: Some Days   Substances: Nicotine  Substance and Sexual Activity   Alcohol use: Yes    Comment: Socially   Drug use: Not Currently    Types: Marijuana   Sexual activity: Yes    Birth control/protection: None     ROS   Objective:   Vitals: BP 117/76 (BP Location: Right Arm)   Pulse 67   Temp 98.2 F (36.8 C) (Oral)   Resp 17   Ht 5' 10 (1.778 m)   Wt 200 lb (90.7 kg)   SpO2 97%   BMI 28.70 kg/m   Physical Exam Constitutional:      General: He is not in acute distress.    Appearance: Normal appearance. He is well-developed and normal weight. He is not ill-appearing, toxic-appearing or diaphoretic.  HENT:     Head: Normocephalic and atraumatic.     Right Ear: Tympanic membrane, ear canal and external ear normal. No drainage, swelling or tenderness. No middle ear  effusion. There is no impacted cerumen. Tympanic membrane is not erythematous or bulging.     Left Ear: Tympanic membrane, ear canal and external ear normal. No drainage, swelling or tenderness.  No middle ear effusion. There is no impacted cerumen. Tympanic membrane is not erythematous or bulging.     Nose: Nose normal. No congestion or rhinorrhea.     Mouth/Throat:     Mouth: Mucous membranes are moist.     Pharynx: No oropharyngeal exudate or posterior oropharyngeal erythema.  Eyes:     General: No scleral icterus.       Right eye: No discharge.        Left eye: No discharge.     Extraocular Movements: Extraocular movements intact.     Conjunctiva/sclera: Conjunctivae normal.  Cardiovascular:     Rate and Rhythm: Normal rate and regular rhythm.     Heart sounds: Normal heart sounds. No murmur heard.    No friction rub. No gallop.  Pulmonary:     Effort: Pulmonary effort is normal. No respiratory distress.     Breath sounds: Normal breath sounds. No stridor. No wheezing, rhonchi or rales.  Musculoskeletal:     Cervical back: Normal range of motion and neck supple. No rigidity. No muscular tenderness.  Neurological:  General: No focal deficit present.     Mental Status: He is alert and oriented to person, place, and time.  Psychiatric:        Mood and Affect: Mood normal.        Behavior: Behavior normal.        Thought Content: Thought content normal.     Assessment and Plan :   PDMP not reviewed this encounter.  1. Viral respiratory infection      Given low risk factors, timeline of illness and hemodynamically stable vital signs deferred respiratory testing.  Patient was in agreement.  Deferred imaging given clear pulmonary exam.  Suspect viral URI, viral syndrome. Physical exam findings reassuring and vital signs stable for discharge. Advised supportive care, offered symptomatic relief. Counseled patient on potential for adverse effects with medications  prescribed/recommended today, ER and return-to-clinic precautions discussed, patient verbalized understanding.      [1] No Known Allergies    Christopher Savannah, PA-C 02/16/24 1238  "

## 2024-02-16 NOTE — ED Triage Notes (Signed)
 Pt states that he has some dizziness, nasal congestion and body aches. X4-5 days  Pt states that he has been taking Tylenol  otc.

## 2024-02-16 NOTE — Discharge Instructions (Signed)
 We will manage this as a viral respiratory illness. For sore throat or cough try using a honey-based tea. Use 3 teaspoons of honey with juice squeezed from half lemon. Place shaved pieces of ginger into 1/2-1 cup of water and warm over stove top. Then mix the ingredients and repeat every 4 hours as needed. Please take ibuprofen  800mg  every 8 hours with food alternating with OR taken together with Tylenol  500mg -650mg  every 6 hours for throat pain, fevers, aches and pains. Hydrate very well with at least 2 liters of water. Eat light meals such as soups (chicken and noodles, vegetable, chicken and wild rice).  Do not eat foods that you are allergic to.  Taking an antihistamine like Zyrtec  (10mg  daily) can help against postnasal drainage, sinus congestion which can cause sinus pain, sinus headaches, throat pain, painful swallowing, coughing.  You can take this together with pseudoephedrine  (Sudafed) at a dose of 60 mg 3 times a day or twice daily as needed for the same kind of nasal drip, congestion.  Use cough syrup as needed.
